# Patient Record
Sex: Female | Born: 1945
Health system: Southern US, Community
[De-identification: ages and names within clinical notes are randomized; demographics above are authoritative.]

## PROBLEM LIST (undated history)

## (undated) HISTORY — PX: EYE SURGERY: SHX253

## (undated) HISTORY — PX: CATARACT EXTRACTION: SUR2

---

## 1975-08-03 HISTORY — PX: TUBAL LIGATION: SHX77

## 1999-08-03 HISTORY — PX: ABDOMINAL HYSTERECTOMY: SHX81

## 2004-07-09 ENCOUNTER — Ambulatory Visit: Payer: Self-pay | Admitting: Family Medicine

## 2004-11-10 ENCOUNTER — Ambulatory Visit: Payer: Self-pay | Admitting: Unknown Physician Specialty

## 2005-07-12 ENCOUNTER — Ambulatory Visit: Payer: Self-pay | Admitting: Family Medicine

## 2006-07-19 ENCOUNTER — Ambulatory Visit: Payer: Self-pay | Admitting: Family Medicine

## 2007-07-24 ENCOUNTER — Ambulatory Visit: Payer: Self-pay | Admitting: Family Medicine

## 2008-07-24 ENCOUNTER — Ambulatory Visit: Payer: Self-pay | Admitting: Family Medicine

## 2008-08-02 HISTORY — PX: BREAST BIOPSY: SHX20

## 2008-08-06 ENCOUNTER — Ambulatory Visit: Payer: Self-pay | Admitting: Family Medicine

## 2010-08-26 DIAGNOSIS — H401190 Primary open-angle glaucoma, unspecified eye, stage unspecified: Secondary | ICD-10-CM | POA: Insufficient documentation

## 2010-08-26 DIAGNOSIS — H2510 Age-related nuclear cataract, unspecified eye: Secondary | ICD-10-CM | POA: Insufficient documentation

## 2010-08-26 DIAGNOSIS — Z9889 Other specified postprocedural states: Secondary | ICD-10-CM | POA: Insufficient documentation

## 2010-09-18 ENCOUNTER — Ambulatory Visit: Payer: Self-pay | Admitting: Unknown Physician Specialty

## 2010-09-22 LAB — PATHOLOGY REPORT

## 2010-10-21 DIAGNOSIS — H401290 Low-tension glaucoma, unspecified eye, stage unspecified: Secondary | ICD-10-CM | POA: Insufficient documentation

## 2011-02-16 DIAGNOSIS — S8263XA Displaced fracture of lateral malleolus of unspecified fibula, initial encounter for closed fracture: Secondary | ICD-10-CM | POA: Insufficient documentation

## 2011-04-21 DIAGNOSIS — M659 Synovitis and tenosynovitis, unspecified: Secondary | ICD-10-CM | POA: Insufficient documentation

## 2011-06-21 DIAGNOSIS — D481 Neoplasm of uncertain behavior of connective and other soft tissue: Secondary | ICD-10-CM | POA: Insufficient documentation

## 2011-06-21 DIAGNOSIS — L820 Inflamed seborrheic keratosis: Secondary | ICD-10-CM | POA: Insufficient documentation

## 2011-10-11 ENCOUNTER — Ambulatory Visit: Payer: Self-pay | Admitting: Unknown Physician Specialty

## 2011-10-13 LAB — PATHOLOGY REPORT

## 2013-02-12 DIAGNOSIS — D1801 Hemangioma of skin and subcutaneous tissue: Secondary | ICD-10-CM | POA: Insufficient documentation

## 2013-02-12 DIAGNOSIS — B078 Other viral warts: Secondary | ICD-10-CM | POA: Insufficient documentation

## 2014-03-14 LAB — HM DEXA SCAN

## 2014-08-12 LAB — HM MAMMOGRAPHY: HM MAMMO: NORMAL

## 2014-10-01 LAB — CBC AND DIFFERENTIAL
HEMATOCRIT: 42 % (ref 36–46)
Hemoglobin: 14.6 g/dL (ref 12.0–16.0)
Platelets: 212 10*3/uL (ref 150–399)
WBC: 5.7 10^3/mL

## 2014-10-07 LAB — HM COLONOSCOPY

## 2014-12-03 LAB — HEPATIC FUNCTION PANEL
ALT: 23 U/L (ref 7–35)
AST: 22 U/L (ref 13–35)

## 2014-12-03 LAB — LIPID PANEL
Cholesterol: 157 mg/dL (ref 0–200)
HDL: 49 mg/dL (ref 35–70)
LDL CALC: 89 mg/dL
TRIGLYCERIDES: 96 mg/dL (ref 40–160)

## 2014-12-03 LAB — BASIC METABOLIC PANEL
BUN: 12 mg/dL (ref 4–21)
CREATININE: 0.8 mg/dL (ref 0.5–1.1)
Glucose: 104 mg/dL
Potassium: 4 mmol/L (ref 3.4–5.3)
SODIUM: 139 mmol/L (ref 137–147)

## 2014-12-03 LAB — HEMOGLOBIN A1C: HEMOGLOBIN A1C: 5.9

## 2015-03-24 ENCOUNTER — Other Ambulatory Visit: Payer: Self-pay

## 2015-03-24 DIAGNOSIS — K219 Gastro-esophageal reflux disease without esophagitis: Secondary | ICD-10-CM

## 2015-03-24 MED ORDER — OMEPRAZOLE 20 MG PO CPDR: 20.0000 mg | DELAYED_RELEASE_CAPSULE | Freq: Two times a day (BID) | ORAL | Status: DC

## 2015-05-08 ENCOUNTER — Other Ambulatory Visit: Payer: Self-pay | Admitting: Family Medicine

## 2015-05-08 DIAGNOSIS — I10 Essential (primary) hypertension: Secondary | ICD-10-CM

## 2015-05-08 DIAGNOSIS — K219 Gastro-esophageal reflux disease without esophagitis: Secondary | ICD-10-CM

## 2015-05-08 DIAGNOSIS — E7800 Pure hypercholesterolemia, unspecified: Secondary | ICD-10-CM

## 2015-05-09 DIAGNOSIS — K635 Polyp of colon: Secondary | ICD-10-CM | POA: Insufficient documentation

## 2015-05-09 DIAGNOSIS — E7800 Pure hypercholesterolemia, unspecified: Secondary | ICD-10-CM | POA: Insufficient documentation

## 2015-05-09 DIAGNOSIS — R739 Hyperglycemia, unspecified: Secondary | ICD-10-CM | POA: Insufficient documentation

## 2015-05-09 DIAGNOSIS — I10 Essential (primary) hypertension: Secondary | ICD-10-CM | POA: Insufficient documentation

## 2015-05-09 DIAGNOSIS — M8580 Other specified disorders of bone density and structure, unspecified site: Secondary | ICD-10-CM | POA: Insufficient documentation

## 2015-05-09 DIAGNOSIS — K219 Gastro-esophageal reflux disease without esophagitis: Secondary | ICD-10-CM | POA: Insufficient documentation

## 2015-08-06 DIAGNOSIS — Z1231 Encounter for screening mammogram for malignant neoplasm of breast: Secondary | ICD-10-CM | POA: Diagnosis not present

## 2015-08-26 DIAGNOSIS — H409 Unspecified glaucoma: Secondary | ICD-10-CM | POA: Insufficient documentation

## 2015-09-15 ENCOUNTER — Encounter: Payer: Self-pay | Admitting: Family Medicine

## 2015-09-15 ENCOUNTER — Ambulatory Visit (INDEPENDENT_AMBULATORY_CARE_PROVIDER_SITE_OTHER): Payer: PPO | Admitting: Family Medicine

## 2015-09-15 VITALS — BP 104/70 | HR 80 | Temp 98.0°F | Resp 20 | Ht 61.0 in | Wt 134.0 lb

## 2015-09-15 DIAGNOSIS — Z0000 Encounter for general adult medical examination without abnormal findings: Secondary | ICD-10-CM

## 2015-09-15 DIAGNOSIS — H04129 Dry eye syndrome of unspecified lacrimal gland: Secondary | ICD-10-CM | POA: Insufficient documentation

## 2015-09-15 DIAGNOSIS — I10 Essential (primary) hypertension: Secondary | ICD-10-CM

## 2015-09-15 DIAGNOSIS — E7800 Pure hypercholesterolemia, unspecified: Secondary | ICD-10-CM

## 2015-09-15 DIAGNOSIS — M8580 Other specified disorders of bone density and structure, unspecified site: Secondary | ICD-10-CM | POA: Diagnosis not present

## 2015-09-15 DIAGNOSIS — R739 Hyperglycemia, unspecified: Secondary | ICD-10-CM | POA: Diagnosis not present

## 2015-09-15 DIAGNOSIS — J449 Chronic obstructive pulmonary disease, unspecified: Secondary | ICD-10-CM | POA: Insufficient documentation

## 2015-09-15 DIAGNOSIS — K2270 Barrett's esophagus without dysplasia: Secondary | ICD-10-CM | POA: Insufficient documentation

## 2015-09-15 NOTE — Progress Notes (Signed)
Patient ID: Sarah Vazquez, female   DOB: [birth year], 70 y.o.   MRN: [id number]       Patient: Sarah Vazquez, Female    DOB: Oct 28, 1945, 70 y.o.   MRN: [id number] Visit Date: 09/15/2015  Today's Provider: Margarita Rana, MD   Chief Complaint  Patient presents with  . Medicare Wellness   Subjective:    Annual wellness visit Sarah Vazquez is a 70 y.o. female. She feels well. She reports exercising 5 days a week. She reports she is sleeping well. 09/13/14 CPE 11/13/10 Pap-neg; HPV-neg 08/06/15 Mammo-BI-RADS 1 10/07/14 Colon-diverticulosis, recheck in 10 yrs 03/14/14 BMD-osteopenia  Lab Results  Component Value Date   WBC 5.7 10/01/2014   HGB 14.6 10/01/2014   HCT 42 10/01/2014   PLT 212 10/01/2014   CHOL 157 12/03/2014   TRIG 96 12/03/2014   HDL 49 12/03/2014   LDLCALC 89 12/03/2014   ALT 23 12/03/2014   AST 22 12/03/2014   NA 139 12/03/2014   K 4.0 12/03/2014   CREATININE 0.8 12/03/2014   BUN 12 12/03/2014   HGBA1C 5.9 12/03/2014    -----------------------------------------------------------   Review of Systems  Constitutional: Negative.   HENT: Positive for postnasal drip.   Eyes: Negative.   Respiratory: Negative.   Cardiovascular: Negative.   Gastrointestinal: Negative.   Endocrine: Negative.   Genitourinary: Negative.   Musculoskeletal: Positive for back pain and arthralgias.  Skin: Negative.   Allergic/Immunologic: Negative.   Neurological: Negative.   Hematological: Negative.   Psychiatric/Behavioral: Negative.     Social History   Social History  . Marital Status: Married    Spouse Name: N/A  . Number of Children: N/A  . Years of Education: N/A   Occupational History  . Not on file.   Social History Main Topics  . Smoking status: Never Smoker   . Smokeless tobacco: Never Used  . Alcohol Use: Yes     Comment: occasional  . Drug Use: No  . Sexual Activity: Not on file   Other Topics Concern  . Not on file   Social  History Narrative    History reviewed. No pertinent past medical history.   Patient Active Problem List   Diagnosis Date Noted  . Barrett esophagus 09/15/2015  . Chronic obstructive pulmonary disease ([place]) 09/15/2015  . Dry eye 09/15/2015  . Hypercholesterolemia 09/15/2015  . Glaucoma 08/26/2015  . Colon polyp 05/09/2015  . OP (osteoporosis) 05/09/2015  . Acid reflux 05/09/2015  . BP (high blood pressure) 05/09/2015  . Blood glucose elevated 05/09/2015  . Hypercholesteremia 05/09/2015  . Hemangioma of skin 02/12/2013  . Common wart 02/12/2013  . Inflamed seborrheic keratosis 06/21/2011  . Connective tissue tumor 06/21/2011  . Tenosynovitis of foot 04/21/2011  . Low tension glaucoma 10/21/2010  . Primary open angle glaucoma 08/26/2010  . Cataract, nuclear sclerotic senile 08/26/2010    Past Surgical History  Procedure Laterality Date  . Breast biopsy  2010  . Abdominal hysterectomy  2001  . Tubal ligation  1977  . Eye surgery Bilateral     glaucoma shunt   . Cataract extraction Bilateral     right 06/18/2010 left 10/2009    Her family history includes Alcohol abuse in her father; CAD in her father; Cancer in her maternal aunt, paternal aunt, paternal grandfather, and paternal uncle; Diabetes in her maternal aunt and maternal uncle; Emphysema in her mother; Heart disease in her father; Hyperlipidemia in her father; Hypertension in her father.    Previous Medications  ARTIFICIAL TEARS 0.1-0.3 % SOLN    At bedtime and PRN during the day   ASPIRIN 81 MG TABLET    Take 1 tablet by mouth daily.   BUSPIRONE (BUSPAR) 15 MG TABLET    Take 1 tablet by mouth at bedtime.    CALCIUM-VITAMIN D PO    Take 1 tablet by mouth 2 (two) times daily.    COENZYME Q10 (COQ10) 200 MG CAPS    Take 1 tablet by mouth daily.   MULTIPLE VITAMIN PO    Take 1 tablet by mouth every other day.    OMEGA-3 FATTY ACIDS (FISH OIL PO)    Take 2 capsules by mouth 2 (two) times daily.   OMEPRAZOLE (PRILOSEC)  20 MG CAPSULE    Take 1 capsule by mouth twice a day   PEG 3350-KCL-NABCB-NACL-NASULF (PEG-3350/ELECTROLYTES) 236 G SOLR       POLYETHYL GLYCOL-PROPYL GLYCOL 0.4-0.3 % SOLN    Apply to eye.   QUINAPRIL (ACCUPRIL) 10 MG TABLET    Take 1 tablet by mouth daily   SIMVASTATIN (ZOCOR) 10 MG TABLET    Take 1 tablet by mouth at bedtime    Patient Care Team: Margarita Rana, MD as PCP - General (Family Medicine)     Objective:   Vitals: BP 104/70 mmHg  Pulse 80  Temp(Src) 98 F (36.7 C) (Oral)  Resp 20  Ht 5\' 1"  (1.549 m)  Wt 134 lb (60.782 kg)  BMI 25.33 kg/m2  Physical Exam  Constitutional: She is oriented to person, place, and time. She appears well-developed and well-nourished.  HENT:  Head: Normocephalic and atraumatic.  Right Ear: Tympanic membrane, external ear and ear canal normal.  Left Ear: Tympanic membrane, external ear and ear canal normal.  Nose: Nose normal.  Mouth/Throat: Uvula is midline, oropharynx is clear and moist and mucous membranes are normal.  Eyes: Conjunctivae, EOM and lids are normal. Pupils are equal, round, and reactive to light.  Neck: Trachea normal and normal range of motion. Neck supple. Carotid bruit is not present. No thyroid mass and no thyromegaly present.  Cardiovascular: Normal rate, regular rhythm and normal heart sounds.   Pulmonary/Chest: Effort normal and breath sounds normal.  Abdominal: Soft. Normal appearance and bowel sounds are normal. There is no hepatosplenomegaly. There is no tenderness.  Musculoskeletal: Normal range of motion.  Lymphadenopathy:    She has no cervical adenopathy.    She has no axillary adenopathy.  Neurological: She is alert and oriented to person, place, and time. She has normal strength. No cranial nerve deficit.  Skin: Skin is warm, dry and intact.  Psychiatric: She has a normal mood and affect. Her speech is normal and behavior is normal. Judgment and thought content normal. Cognition and memory are normal.     Activities of Daily Living In your present state of health, do you have any difficulty performing the following activities: 09/15/2015  Hearing? Y  Vision? N  Difficulty concentrating or making decisions? N  Walking or climbing stairs? N  Dressing or bathing? N  Doing errands, shopping? N    Fall Risk Assessment Fall Risk  09/15/2015  Falls in the past year? No     Depression Screen PHQ 2/9 Scores 09/15/2015  PHQ - 2 Score 0    Cognitive Testing - 6-CIT  Correct? Score   What year is it? yes 0 0 or 4  What month is it? yes 0 0 or 3  Memorize:    Pia Mau,  [address],  [place],      What time is it? (within 1 hour) yes 0 0 or 3  Count backwards from 20 yes 0 0, 2, or 4  Name the months of the year yes 0 0, 2, or 4  Repeat name & address above no 1 0, 2, 4, 6, 8, or 10       TOTAL SCORE  1/28   Interpretation:  Normal  Normal (0-7) Abnormal (8-28)       Assessment & Plan:     Annual Wellness Visit  Reviewed patient's Family Medical History Reviewed and updated list of patient's medical providers Assessment of cognitive impairment was done Assessed patient's functional ability Established a written schedule for health screening [place] Completed and Reviewed  Exercise Activities and Dietary recommendations Goals    None      Immunization History  Administered Date(s) Administered  . Hepatitis A 05/12/2005  . Hepatitis B 06/09/2005, 01/14/2006  . Pneumococcal Conjugate-13 03/11/2014  . Pneumococcal Polysaccharide-23 01/04/2011  . Td 06/07/2008  . Tdap 06/07/2008      1. Medicare annual wellness visit, subsequent Stable. Patient advised to continue eating healthy and exercise daily.  2. Essential hypertension - CBC with Differential/Platelet - Comprehensive metabolic panel  3. Blood glucose elevated - Hemoglobin A1c  4. Hypercholesteremia - Lipid Panel With LDL/HDL Ratio - TSH  5. Osteopenia Stable.  Patient advised to F/U in the fall. BMD will be ordered at that time.    Patient seen and examined by Dr. Jerrell Belfast, and note scribed by Philbert Riser. Dimas, CMA.  I have reviewed the document for accuracy and completeness and I agree with above. Jerrell Belfast, MD   Margarita Rana, MD     ------------------------------------------------------------------------------------------------------------

## 2015-10-10 DIAGNOSIS — I10 Essential (primary) hypertension: Secondary | ICD-10-CM | POA: Diagnosis not present

## 2015-10-10 DIAGNOSIS — E7800 Pure hypercholesterolemia, unspecified: Secondary | ICD-10-CM | POA: Diagnosis not present

## 2015-10-10 DIAGNOSIS — R739 Hyperglycemia, unspecified: Secondary | ICD-10-CM | POA: Diagnosis not present

## 2015-10-11 LAB — CBC WITH DIFFERENTIAL/PLATELET
BASOS ABS: 0.1 10*3/uL (ref 0.0–0.2)
Basos: 1 %
EOS (ABSOLUTE): 0.1 10*3/uL (ref 0.0–0.4)
Eos: 3 %
HEMOGLOBIN: 14.2 g/dL (ref 11.1–15.9)
Hematocrit: 41.4 % (ref 34.0–46.6)
IMMATURE GRANS (ABS): 0 10*3/uL (ref 0.0–0.1)
Immature Granulocytes: 0 %
LYMPHS ABS: 1.6 10*3/uL (ref 0.7–3.1)
LYMPHS: 37 %
MCH: 29.3 pg (ref 26.6–33.0)
MCHC: 34.3 g/dL (ref 31.5–35.7)
MCV: 85 fL (ref 79–97)
MONOCYTES: 7 %
Monocytes Absolute: 0.3 10*3/uL (ref 0.1–0.9)
NEUTROS ABS: 2.3 10*3/uL (ref 1.4–7.0)
Neutrophils: 52 %
PLATELETS: 190 10*3/uL (ref 150–379)
RBC: 4.85 x10E6/uL (ref 3.77–5.28)
RDW: 13.6 % (ref 12.3–15.4)
WBC: 4.3 10*3/uL (ref 3.4–10.8)

## 2015-10-11 LAB — LIPID PANEL WITH LDL/HDL RATIO
CHOLESTEROL TOTAL: 172 mg/dL (ref 100–199)
HDL: 54 mg/dL (ref >39)
LDL Calculated: 98 mg/dL (ref 0–99)
LDL/HDL RATIO: 1.8 ratio (ref 0.0–3.2)
TRIGLYCERIDES: 101 mg/dL (ref 0–149)
VLDL Cholesterol Cal: 20 mg/dL (ref 5–40)

## 2015-10-11 LAB — COMPREHENSIVE METABOLIC PANEL
ALBUMIN: 4.3 g/dL (ref 3.6–4.8)
ALK PHOS: 65 IU/L (ref 39–117)
ALT: 23 IU/L (ref 0–32)
AST: 19 IU/L (ref 0–40)
Albumin/Globulin Ratio: 1.9 (ref 1.1–2.5)
BILIRUBIN TOTAL: 0.5 mg/dL (ref 0.0–1.2)
BUN/Creatinine Ratio: 19 (ref 11–26)
BUN: 16 mg/dL (ref 8–27)
CHLORIDE: 105 mmol/L (ref 96–106)
CO2: 25 mmol/L (ref 18–29)
CREATININE: 0.83 mg/dL (ref 0.57–1.00)
Calcium: 9.7 mg/dL (ref 8.7–10.3)
GFR calc Af Amer: 83 mL/min/{1.73_m2} (ref >59)
GFR calc non Af Amer: 72 mL/min/{1.73_m2} (ref >59)
GLUCOSE: 101 mg/dL — AB (ref 65–99)
Globulin, Total: 2.3 g/dL (ref 1.5–4.5)
Potassium: 4.3 mmol/L (ref 3.5–5.2)
Sodium: 144 mmol/L (ref 134–144)
Total Protein: 6.6 g/dL (ref 6.0–8.5)

## 2015-10-11 LAB — TSH: TSH: 2.72 u[IU]/mL (ref 0.450–4.500)

## 2015-10-11 LAB — HEMOGLOBIN A1C
ESTIMATED AVERAGE GLUCOSE: 120 mg/dL
HEMOGLOBIN A1C: 5.8 % — AB (ref 4.8–5.6)

## 2015-10-13 ENCOUNTER — Telehealth: Payer: Self-pay

## 2015-10-13 NOTE — Telephone Encounter (Signed)
-----   Message from Margarita Rana, MD sent at 10/11/2015  7:46 AM EST ----- Labs stable. Please notify patient. Thanks.

## 2015-10-13 NOTE — Telephone Encounter (Signed)
Pt advised.   Thanks,   -Payson Evrard  

## 2015-11-19 DIAGNOSIS — H401231 Low-tension glaucoma, bilateral, mild stage: Secondary | ICD-10-CM | POA: Diagnosis not present

## 2015-11-19 DIAGNOSIS — H2513 Age-related nuclear cataract, bilateral: Secondary | ICD-10-CM | POA: Diagnosis not present

## 2016-03-24 DIAGNOSIS — H2513 Age-related nuclear cataract, bilateral: Secondary | ICD-10-CM | POA: Diagnosis not present

## 2016-03-24 DIAGNOSIS — H401211 Low-tension glaucoma, right eye, mild stage: Secondary | ICD-10-CM | POA: Diagnosis not present

## 2016-03-24 DIAGNOSIS — H401222 Low-tension glaucoma, left eye, moderate stage: Secondary | ICD-10-CM | POA: Diagnosis not present

## 2016-06-07 DIAGNOSIS — R0789 Other chest pain: Secondary | ICD-10-CM | POA: Diagnosis not present

## 2016-06-07 DIAGNOSIS — Z8249 Family history of ischemic heart disease and other diseases of the circulatory system: Secondary | ICD-10-CM | POA: Diagnosis not present

## 2016-06-07 DIAGNOSIS — I10 Essential (primary) hypertension: Secondary | ICD-10-CM | POA: Diagnosis not present

## 2016-07-16 DIAGNOSIS — H353130 Nonexudative age-related macular degeneration, bilateral, stage unspecified: Secondary | ICD-10-CM | POA: Diagnosis not present

## 2016-07-16 DIAGNOSIS — H401232 Low-tension glaucoma, bilateral, moderate stage: Secondary | ICD-10-CM | POA: Diagnosis not present

## 2016-07-16 DIAGNOSIS — H2513 Age-related nuclear cataract, bilateral: Secondary | ICD-10-CM | POA: Diagnosis not present

## 2016-08-06 DIAGNOSIS — Z1231 Encounter for screening mammogram for malignant neoplasm of breast: Secondary | ICD-10-CM | POA: Diagnosis not present

## 2016-08-24 DIAGNOSIS — R0789 Other chest pain: Secondary | ICD-10-CM | POA: Diagnosis not present

## 2016-08-24 DIAGNOSIS — Z8249 Family history of ischemic heart disease and other diseases of the circulatory system: Secondary | ICD-10-CM | POA: Diagnosis not present

## 2016-09-07 DIAGNOSIS — R7303 Prediabetes: Secondary | ICD-10-CM | POA: Diagnosis not present

## 2016-09-07 DIAGNOSIS — M8580 Other specified disorders of bone density and structure, unspecified site: Secondary | ICD-10-CM | POA: Diagnosis not present

## 2016-09-07 DIAGNOSIS — E7800 Pure hypercholesterolemia, unspecified: Secondary | ICD-10-CM | POA: Diagnosis not present

## 2016-09-07 DIAGNOSIS — I10 Essential (primary) hypertension: Secondary | ICD-10-CM | POA: Diagnosis not present

## 2016-11-05 DIAGNOSIS — E7800 Pure hypercholesterolemia, unspecified: Secondary | ICD-10-CM | POA: Diagnosis not present

## 2016-11-17 DIAGNOSIS — H401211 Low-tension glaucoma, right eye, mild stage: Secondary | ICD-10-CM | POA: Diagnosis not present

## 2016-11-17 DIAGNOSIS — H2513 Age-related nuclear cataract, bilateral: Secondary | ICD-10-CM | POA: Diagnosis not present

## 2016-11-17 DIAGNOSIS — H353130 Nonexudative age-related macular degeneration, bilateral, stage unspecified: Secondary | ICD-10-CM | POA: Diagnosis not present

## 2016-11-17 DIAGNOSIS — H401222 Low-tension glaucoma, left eye, moderate stage: Secondary | ICD-10-CM | POA: Diagnosis not present

## 2017-01-12 DIAGNOSIS — H401111 Primary open-angle glaucoma, right eye, mild stage: Secondary | ICD-10-CM | POA: Diagnosis not present

## 2017-01-12 DIAGNOSIS — H401222 Low-tension glaucoma, left eye, moderate stage: Secondary | ICD-10-CM | POA: Diagnosis not present

## 2017-01-12 DIAGNOSIS — H2513 Age-related nuclear cataract, bilateral: Secondary | ICD-10-CM | POA: Diagnosis not present

## 2017-01-12 DIAGNOSIS — H3530 Unspecified macular degeneration: Secondary | ICD-10-CM | POA: Diagnosis not present

## 2017-01-20 ENCOUNTER — Telehealth: Payer: Self-pay | Admitting: Family Medicine

## 2017-03-28 DIAGNOSIS — E7800 Pure hypercholesterolemia, unspecified: Secondary | ICD-10-CM | POA: Diagnosis not present

## 2017-03-28 DIAGNOSIS — R7303 Prediabetes: Secondary | ICD-10-CM | POA: Diagnosis not present

## 2017-03-28 DIAGNOSIS — I10 Essential (primary) hypertension: Secondary | ICD-10-CM | POA: Diagnosis not present

## 2017-06-15 DIAGNOSIS — H2513 Age-related nuclear cataract, bilateral: Secondary | ICD-10-CM | POA: Diagnosis not present

## 2017-06-15 DIAGNOSIS — H401211 Low-tension glaucoma, right eye, mild stage: Secondary | ICD-10-CM | POA: Diagnosis not present

## 2017-06-15 DIAGNOSIS — H401222 Low-tension glaucoma, left eye, moderate stage: Secondary | ICD-10-CM | POA: Diagnosis not present

## 2017-06-15 DIAGNOSIS — H353230 Exudative age-related macular degeneration, bilateral, stage unspecified: Secondary | ICD-10-CM | POA: Diagnosis not present

## 2017-08-12 DIAGNOSIS — Z1231 Encounter for screening mammogram for malignant neoplasm of breast: Secondary | ICD-10-CM | POA: Diagnosis not present

## 2017-08-30 NOTE — Telephone Encounter (Signed)
Left message today regarding need to schedule [facility] if she is not following up with another provider.  Last Physical was 09/15/2015 with Dr Venia Minks

## 2017-08-30 NOTE — Telephone Encounter (Signed)
Pt returned call to let Amy know she is no longer a pt at [facility]. Thanks TNP

## 2017-09-28 DIAGNOSIS — R0982 Postnasal drip: Secondary | ICD-10-CM | POA: Diagnosis not present

## 2017-09-28 DIAGNOSIS — Z23 Encounter for immunization: Secondary | ICD-10-CM | POA: Diagnosis not present

## 2017-09-28 DIAGNOSIS — J309 Allergic rhinitis, unspecified: Secondary | ICD-10-CM | POA: Diagnosis not present

## 2017-09-28 DIAGNOSIS — R7303 Prediabetes: Secondary | ICD-10-CM | POA: Diagnosis not present

## 2017-09-28 DIAGNOSIS — E7800 Pure hypercholesterolemia, unspecified: Secondary | ICD-10-CM | POA: Diagnosis not present

## 2017-09-28 DIAGNOSIS — I10 Essential (primary) hypertension: Secondary | ICD-10-CM | POA: Diagnosis not present

## 2017-11-02 DIAGNOSIS — H401231 Low-tension glaucoma, bilateral, mild stage: Secondary | ICD-10-CM | POA: Diagnosis not present

## 2017-11-02 DIAGNOSIS — H353130 Nonexudative age-related macular degeneration, bilateral, stage unspecified: Secondary | ICD-10-CM | POA: Diagnosis not present

## 2017-11-02 DIAGNOSIS — H2513 Age-related nuclear cataract, bilateral: Secondary | ICD-10-CM | POA: Diagnosis not present

## 2017-11-30 DIAGNOSIS — I10 Essential (primary) hypertension: Secondary | ICD-10-CM | POA: Diagnosis not present

## 2017-11-30 DIAGNOSIS — E7800 Pure hypercholesterolemia, unspecified: Secondary | ICD-10-CM | POA: Diagnosis not present

## 2017-12-06 DIAGNOSIS — I10 Essential (primary) hypertension: Secondary | ICD-10-CM | POA: Diagnosis not present

## 2017-12-06 DIAGNOSIS — H6123 Impacted cerumen, bilateral: Secondary | ICD-10-CM | POA: Diagnosis not present

## 2017-12-06 DIAGNOSIS — E7800 Pure hypercholesterolemia, unspecified: Secondary | ICD-10-CM | POA: Diagnosis not present

## 2018-03-01 DIAGNOSIS — H2513 Age-related nuclear cataract, bilateral: Secondary | ICD-10-CM | POA: Diagnosis not present

## 2018-03-01 DIAGNOSIS — H401222 Low-tension glaucoma, left eye, moderate stage: Secondary | ICD-10-CM | POA: Diagnosis not present

## 2018-03-01 DIAGNOSIS — H401211 Low-tension glaucoma, right eye, mild stage: Secondary | ICD-10-CM | POA: Diagnosis not present

## 2018-03-01 DIAGNOSIS — H353130 Nonexudative age-related macular degeneration, bilateral, stage unspecified: Secondary | ICD-10-CM | POA: Diagnosis not present

## 2018-06-20 DIAGNOSIS — H401231 Low-tension glaucoma, bilateral, mild stage: Secondary | ICD-10-CM | POA: Diagnosis not present

## 2018-06-23 DIAGNOSIS — H353112 Nonexudative age-related macular degeneration, right eye, intermediate dry stage: Secondary | ICD-10-CM | POA: Diagnosis not present

## 2018-06-23 DIAGNOSIS — H2513 Age-related nuclear cataract, bilateral: Secondary | ICD-10-CM | POA: Diagnosis not present

## 2018-06-23 DIAGNOSIS — H353222 Exudative age-related macular degeneration, left eye, with inactive choroidal neovascularization: Secondary | ICD-10-CM | POA: Diagnosis not present

## 2018-08-01 ENCOUNTER — Encounter: Payer: Self-pay | Admitting: Podiatry

## 2018-08-01 ENCOUNTER — Ambulatory Visit: Payer: PPO | Admitting: Podiatry

## 2018-08-01 ENCOUNTER — Ambulatory Visit (INDEPENDENT_AMBULATORY_CARE_PROVIDER_SITE_OTHER): Payer: PPO

## 2018-08-01 ENCOUNTER — Encounter

## 2018-08-01 ENCOUNTER — Other Ambulatory Visit: Payer: Self-pay | Admitting: Podiatry

## 2018-08-01 VITALS — BP 118/67 | HR 77

## 2018-08-01 DIAGNOSIS — M779 Enthesopathy, unspecified: Secondary | ICD-10-CM

## 2018-08-01 DIAGNOSIS — M76822 Posterior tibial tendinitis, left leg: Secondary | ICD-10-CM

## 2018-08-08 ENCOUNTER — Telehealth: Payer: Self-pay | Admitting: Podiatry

## 2018-08-08 DIAGNOSIS — M76822 Posterior tibial tendinitis, left leg: Secondary | ICD-10-CM

## 2018-08-08 NOTE — Addendum Note (Signed)
Addended by: Graceann Congress D on: 08/08/2018 02:10 PM   Modules accepted: Orders

## 2018-08-08 NOTE — Telephone Encounter (Signed)
Pt was seen 12/31 and was supposed to have and MRI scheduled. Pt has not heard anything back about MRI. Please give pt a call.

## 2018-08-08 NOTE — Telephone Encounter (Signed)
Patient has been notified that no precert is required.  She will call main scheduling to set up her appt to her convenience.

## 2018-08-08 NOTE — Progress Notes (Signed)
   HPI: 73 year old female presents today for evaluation of left ankle pain.  Patient states that approximately 7 years ago she did fracture her ankle and it was managed nonsurgically.  She has orthotics but she says that the ankle feels like it is leaning inward and it is causing discomfort.  The discomfort has been persistent for the past 2 months with a gradual onset.  She has noticed that her arch has been collapsed to the left foot and ankle for several years now.  She presents today for further treatment and evaluation  History reviewed. No pertinent past medical history.   Physical Exam: General: The patient is alert and oriented x3 in no acute distress.  Dermatology: Skin is warm, dry and supple bilateral lower extremities. Negative for open lesions or macerations.  Vascular: Palpable pedal pulses bilaterally. No edema or erythema noted. Capillary refill within normal limits.  Neurological: Epicritic and protective threshold grossly intact bilaterally.   Musculoskeletal Exam: Collapse of the medial longitudinal arch noted with a significant amount of pain on palpation along the posterior tibial tendon.  Concomitant rear foot valgus also noted.  Range of motion within normal limits to all pedal and ankle joints bilateral. Muscle strength 5/5 in all groups bilateral.   Radiographic Exam:  Normal osseous mineralization. Joint spaces preserved. No fracture/dislocation/boney destruction.  Collapse of the calcaneal inclination angle and metatarsal declination angle noted with exposure of the head of the talus in relationship to the navicular  Assessment: 1.  Posterior tibial tendinitis left x years   Plan of Care:  1. Patient evaluated. X-Rays reviewed.  2.  Ankle brace dispensed to support the foot and ankle 3.  Today we can order an MRI left ankle to rule out the extent of the posterior tibial tendinitis and possible rupture of the posterior tibial tendon 4.  Continue current custom  molded insoles 5.  The patient may eventually need custom molded [place] type brace and new orthotics. 6.  Pending the MRI results the patient may also require surgery at one point. 7.  Return to clinic in 3 weeks to review MRI results      Edrick Kins, DPM [facility]  Dr. Edrick Kins, DPM    [address], [place] [address]                Office [phone]  Fax [phone]

## 2018-08-14 DIAGNOSIS — Z1231 Encounter for screening mammogram for malignant neoplasm of breast: Secondary | ICD-10-CM | POA: Diagnosis not present

## 2018-08-18 ENCOUNTER — Ambulatory Visit
Admission: RE | Admit: 2018-08-18 | Discharge: 2018-08-18 | Disposition: A | Discharge status: Home / Self Care | Payer: PPO | Source: Ambulatory Visit | Attending: Podiatry | Admitting: Podiatry

## 2018-08-18 DIAGNOSIS — M76822 Posterior tibial tendinitis, left leg: Secondary | ICD-10-CM | POA: Diagnosis not present

## 2018-08-18 DIAGNOSIS — M76821 Posterior tibial tendinitis, right leg: Secondary | ICD-10-CM | POA: Diagnosis not present

## 2018-08-22 ENCOUNTER — Encounter: Payer: Self-pay | Admitting: Podiatry

## 2018-08-22 ENCOUNTER — Ambulatory Visit: Payer: PPO | Admitting: Podiatry

## 2018-08-22 DIAGNOSIS — M76822 Posterior tibial tendinitis, left leg: Secondary | ICD-10-CM

## 2018-08-22 NOTE — Patient Instructions (Signed)
Pre-Operative Instructions  Congratulations, you have decided to take an important step towards improving your quality of life.  You can be assured that the doctors and staff at [facility] will be with you every step of the way.  Here are some important things you should know:  1. Plan to be at the surgery center/hospital at least 1 (one) hour prior to your scheduled time, unless otherwise directed by the surgical center/hospital staff.  You must have a responsible adult accompany you, remain during the surgery and drive you home.  Make sure you have directions to the surgical center/hospital to ensure you arrive on time. 2. If you are having surgery at [facility] or [facility] hospitals, you will need a copy of your medical history and physical form from your family physician within one month prior to the date of surgery. We will give you a form for your primary physician to complete.  3. We make every effort to accommodate the date you request for surgery.  However, there are times where surgery dates or times have to be moved.  We will contact you as soon as possible if a change in schedule is required.   4. No aspirin/ibuprofen for one week before surgery.  If you are on aspirin, any non-steroidal anti-inflammatory medications (Mobic, Aleve, Ibuprofen) should not be taken seven (7) days prior to your surgery.  You make take Tylenol for pain prior to surgery.  5. Medications - If you are taking daily heart and blood pressure medications, seizure, reflux, allergy, asthma, anxiety, pain or diabetes medications, make sure you notify the surgery center/hospital before the day of surgery so they can tell you which medications you should take or avoid the day of surgery. 6. No food or drink after midnight the night before surgery unless directed otherwise by surgical center/hospital staff. 7. No alcoholic beverages 24-hours prior to surgery.  No smoking 24-hours prior or 24-hours after  surgery. 8. Wear loose pants or shorts. They should be loose enough to fit over bandages, boots, and casts. 9. Don't wear slip-on shoes. Sneakers are preferred. 10. Bring your boot with you to the surgery center/hospital.  Also bring crutches or a walker if your physician has prescribed it for you.  If you do not have this equipment, it will be provided for you after surgery. 11. If you have not been contacted by the surgery center/hospital by the day before your surgery, call to confirm the date and time of your surgery. 12. Leave-time from work may vary depending on the type of surgery you have.  Appropriate arrangements should be made prior to surgery with your employer. 13. Prescriptions will be provided immediately following surgery by your doctor.  Fill these as soon as possible after surgery and take the medication as directed. Pain medications will not be refilled on weekends and must be approved by the doctor. 14. Remove nail polish on the operative foot and avoid getting pedicures prior to surgery. 15. Wash the night before surgery.  The night before surgery wash the foot and leg well with water and the antibacterial soap provided. Be sure to pay special attention to beneath the toenails and in between the toes.  Wash for at least three (3) minutes. Rinse thoroughly with water and dry well with a towel.  Perform this wash unless told not to do so by your physician.  Enclosed: 1 Ice pack (please put in freezer the night before surgery)   1 Hibiclens skin cleaner     Pre-op instructions  If you have any questions regarding the instructions, please do not hesitate to call our office.  [place]: [address], [place], [place] [address] -- [phone]  [place]: [address] [place], [place] [address] -- [phone]  [place]: [address]  [place], [place] [address] -- [phone]  [place]: [address], [address], [place],  [address] -- [phone]  Website:  [email or web address] 

## 2018-08-23 ENCOUNTER — Telehealth: Payer: Self-pay | Admitting: *Deleted

## 2018-08-23 NOTE — Telephone Encounter (Signed)
"  I need to schedule my surgery with Dr. Amalia Hailey as soon as possible.  Please call me back."

## 2018-08-24 NOTE — Telephone Encounter (Signed)
"  I spoke to the surgical center and they are in-network with my insurance.  I'd like to schedule my surgery before my upcoming vacation.  I'd like to do it before April."  Dr. Rebekah [place] next available date is March 12.  Would you like that date?  "That date will be fine.  Will I need to see my primary care doctor prior to having my surgery?"  I don't have you paperwork at this time to see if he requested it.  Do you have any serious health issues?  "No, I do not."  You probably will not need to.  "What time will I need to be there?"  Someone from the surgical center will give you a call a day or two prior to your surgical date and they will give you your arrival time.  "Okay, thank you so much."  (Please send paperwork)

## 2018-08-28 NOTE — Progress Notes (Signed)
   HPI: 73 year old female presents today for follow up evaluation of left PT tendinitis. She states her pain has improved some but has not resolved. She has been using the ankle brace as directed. There are no modifying factors noted. She presents today for further treatment and evaluation.  No past medical history on file.   Physical Exam: General: The patient is alert and oriented x3 in no acute distress.  Dermatology: Skin is warm, dry and supple bilateral lower extremities. Negative for open lesions or macerations.  Vascular: Palpable pedal pulses bilaterally. No edema or erythema noted. Capillary refill within normal limits.  Neurological: Epicritic and protective threshold grossly intact bilaterally.   Musculoskeletal Exam: Collapse of the medial longitudinal arch noted with a significant amount of pain on palpation along the posterior tibial tendon.  Concomitant rear foot valgus also noted.  Range of motion within normal limits to all pedal and ankle joints bilateral. Muscle strength 5/5 in all groups bilateral.   MRI Impression:  1. Intrasubstance longitudinal split tear of the posterior tibialis tendon with a single point of extension to the tendon surface with adjacent inflammation of the soft tissues and in the adjacent medial malleolus. 2. Slight degenerative hypertrophy of the distal Achilles tendon.  Assessment: 1.  Posterior tibial tendinitis left x years 2. Longitudinal split tear PT tendon left    Plan of Care:  1. Patient evaluated. MRI reviewed.  2. Today we discussed the conservative versus surgical management of the presenting pathology. The patient opts for surgical management. All possible complications and details of the procedure were explained. All patient questions were answered. No guarantees were expressed or implied. 3. Authorization for surgery was initiated today. Surgery will consist of posterior tibial tendon repair left.  4. Appointment with Liliane Channel,  Pedorthist, for custom molded orthotics to wear after surgery. 5. Return to clinic one week post op.      Edrick Kins, DPM [facility]  Dr. Edrick Kins, DPM    [address],  [address]                Office [phone]  Fax [phone]

## 2018-08-30 ENCOUNTER — Ambulatory Visit (INDEPENDENT_AMBULATORY_CARE_PROVIDER_SITE_OTHER): Payer: PPO | Admitting: Orthotics

## 2018-08-30 DIAGNOSIS — M76821 Posterior tibial tendinitis, right leg: Secondary | ICD-10-CM

## 2018-08-30 DIAGNOSIS — M76822 Posterior tibial tendinitis, left leg: Secondary | ICD-10-CM

## 2018-08-30 NOTE — Progress Notes (Signed)
Patient presents today with a hx of PTTD/AAF.  Upon assessment, patient has pronounced pes planus w/ a valgus RF deformity.  Patient has medially shifted talus/navicular.  Goal is provide longitudinal arch support and RF stability.  Plan on deep heel cup, hug arch, wide foot orthosis w/ medial flange and varus correction for RF valgus deformity.  Patient educated in the progessive nature of PTTD and financial responsibility.  

## 2018-09-15 DIAGNOSIS — H401232 Low-tension glaucoma, bilateral, moderate stage: Secondary | ICD-10-CM | POA: Diagnosis not present

## 2018-09-15 DIAGNOSIS — H353131 Nonexudative age-related macular degeneration, bilateral, early dry stage: Secondary | ICD-10-CM | POA: Diagnosis not present

## 2018-09-15 DIAGNOSIS — H2513 Age-related nuclear cataract, bilateral: Secondary | ICD-10-CM | POA: Diagnosis not present

## 2018-09-15 DIAGNOSIS — H35363 Drusen (degenerative) of macula, bilateral: Secondary | ICD-10-CM | POA: Diagnosis not present

## 2018-09-20 ENCOUNTER — Ambulatory Visit: Payer: PPO | Admitting: Orthotics

## 2018-09-20 DIAGNOSIS — M76822 Posterior tibial tendinitis, left leg: Secondary | ICD-10-CM

## 2018-09-20 NOTE — Progress Notes (Signed)
Patient came in today to pick up custom made foot orthotics.  The goals were accomplished and the patient reported no dissatisfaction with said orthotics.  Patient was advised of breakin period and how to report any issues. 

## 2018-10-03 DIAGNOSIS — H353131 Nonexudative age-related macular degeneration, bilateral, early dry stage: Secondary | ICD-10-CM | POA: Diagnosis not present

## 2018-10-03 DIAGNOSIS — H401232 Low-tension glaucoma, bilateral, moderate stage: Secondary | ICD-10-CM | POA: Diagnosis not present

## 2018-10-03 DIAGNOSIS — H2513 Age-related nuclear cataract, bilateral: Secondary | ICD-10-CM | POA: Diagnosis not present

## 2018-10-12 ENCOUNTER — Telehealth: Payer: Self-pay | Admitting: Podiatry

## 2018-10-12 DIAGNOSIS — M76822 Posterior tibial tendinitis, left leg: Secondary | ICD-10-CM | POA: Diagnosis not present

## 2018-10-12 DIAGNOSIS — M66372 Spontaneous rupture of flexor tendons, left ankle and foot: Secondary | ICD-10-CM | POA: Diagnosis not present

## 2018-10-12 DIAGNOSIS — E7800 Pure hypercholesterolemia, unspecified: Secondary | ICD-10-CM | POA: Diagnosis not present

## 2018-10-12 DIAGNOSIS — M25572 Pain in left ankle and joints of left foot: Secondary | ICD-10-CM | POA: Diagnosis not present

## 2018-10-12 NOTE — Telephone Encounter (Signed)
Left message for pharmacy to fill the percocet and motrin Dr. Amalia Hailey had wanted pt to have good pain coverage.

## 2018-10-12 NOTE — Telephone Encounter (Signed)
Pharmacy called about Rx dropped off for percocet and motrin. Pt has requested [place] not to fill the motrin. Calling to see if that is okay since both prescriptions are written on the same script.

## 2018-10-12 NOTE — Telephone Encounter (Signed)
Left message informing pt Dr. Amalia Hailey had prescribed motrin and percocet to have good pain coverage, to take the motrin in between the dosing of the percocet, and take a little snack before taking each.

## 2018-10-12 NOTE — Telephone Encounter (Signed)
I informed [facility], pharmacist, I had left message for pt reasoning for the motrin, then pt called and states she has motrin, so cancelled the motrin orders with [facility].

## 2018-10-16 ENCOUNTER — Telehealth: Payer: Self-pay

## 2018-10-16 ENCOUNTER — Encounter: Payer: Self-pay | Admitting: Podiatry

## 2018-10-16 NOTE — Telephone Encounter (Signed)
Called pt post-surgery; pt stated, "doing very good, only taking low dose ibuprofen, not having much pain"; asked pt if bandages were still in tact; she states, "yes, will see him in the office Friday"; informed pt to continue to elevate foot and stay off of it as much as possible until then.

## 2018-10-20 ENCOUNTER — Other Ambulatory Visit: Payer: Self-pay

## 2018-10-20 ENCOUNTER — Ambulatory Visit (INDEPENDENT_AMBULATORY_CARE_PROVIDER_SITE_OTHER): Payer: Self-pay | Admitting: Podiatry

## 2018-10-20 VITALS — BP 105/72 | HR 71 | Temp 98.5°F

## 2018-10-20 DIAGNOSIS — M76822 Posterior tibial tendinitis, left leg: Secondary | ICD-10-CM

## 2018-10-20 DIAGNOSIS — Z9889 Other specified postprocedural states: Secondary | ICD-10-CM

## 2018-10-24 NOTE — Progress Notes (Signed)
   Subjective:  Patient presents today status post posterior tibial tendon repair left. DOS: 10/12/2018. She states she is doing well. She reports some mild pain that is tolerable with Ibuprofen. There are no modifying factors noted. She denies nausea, vomiting, SOB, CP, fever or chills. Patient is here for further evaluation and treatment.    No past medical history on file.    Objective/Physical Exam Neurovascular status intact.  Skin incisions appear to be well coapted with sutures and staples intact. No sign of infectious process noted. No dehiscence. No active bleeding noted. Moderate edema noted to the surgical extremity.  Assessment: 1. s/p posterior tibial tendon repair left. DOS: 10/12/2018   Plan of Care:  1. Patient was evaluated. 2. Dressing changed. Keep clean, dry and intact for one week.  3. Continue nonweightbearing in CAM boot with walker.  4. Return to clinic in one week for staple removal.    Edrick Kins, DPM [facility]  Dr. Edrick Kins, [address]                                        [place], [place] [address]                Office [phone]  Fax [phone]

## 2018-10-31 ENCOUNTER — Ambulatory Visit (INDEPENDENT_AMBULATORY_CARE_PROVIDER_SITE_OTHER): Payer: PPO | Admitting: Podiatry

## 2018-10-31 ENCOUNTER — Other Ambulatory Visit: Payer: Self-pay

## 2018-10-31 ENCOUNTER — Encounter: Payer: Self-pay | Admitting: Podiatry

## 2018-10-31 VITALS — Temp 97.5°F

## 2018-10-31 DIAGNOSIS — M76822 Posterior tibial tendinitis, left leg: Secondary | ICD-10-CM

## 2018-10-31 DIAGNOSIS — Z9889 Other specified postprocedural states: Secondary | ICD-10-CM

## 2018-10-31 NOTE — Progress Notes (Signed)
   Subjective:  Patient presents today status post posterior tibial tendon repair left. DOS: 10/12/2018. She states she is doing well. She reports some mild pain that is tolerable with Ibuprofen.  The patient has been nonweightbearing in the immobilization cam boot using a walker.  She states that she continues to get some swelling throughout the day.  No new complaints at this time   No past medical history on file.   Objective/Physical Exam Neurovascular status intact.  Skin incisions appear to be well coapted with sutures and staples intact. No sign of infectious process noted. No dehiscence. No active bleeding noted. Moderate edema noted to the surgical extremity.  Negative for any significant pain on palpation along the incision site  Assessment: 1. s/p posterior tibial tendon repair left. DOS: 10/12/2018   Plan of Care:  1. Patient was evaluated. 2.  Stainless steel skin staples were removed today 3.  Compression anklet dispensed 4.  Continue strict nonweightbearing in the cam boot x2 weeks.  At that time she can begin weightbearing and slowly transition into good supportive tennis shoes 5.  Return to clinic in 4 weeks   Edrick Kins, DPM [facility]  Dr. Edrick Kins, [address]                                        [place], [place] [address]                Office [phone]  Fax [phone]

## 2018-11-28 ENCOUNTER — Other Ambulatory Visit: Payer: Self-pay

## 2018-11-28 ENCOUNTER — Ambulatory Visit (INDEPENDENT_AMBULATORY_CARE_PROVIDER_SITE_OTHER): Payer: PPO | Admitting: Podiatry

## 2018-11-28 ENCOUNTER — Encounter: Payer: Self-pay | Admitting: Podiatry

## 2018-11-28 VITALS — Temp 97.7°F

## 2018-11-28 DIAGNOSIS — M76822 Posterior tibial tendinitis, left leg: Secondary | ICD-10-CM

## 2018-11-28 DIAGNOSIS — Z9889 Other specified postprocedural states: Secondary | ICD-10-CM

## 2018-11-28 NOTE — Progress Notes (Signed)
   Subjective:  Patient presents today status post posterior tibial tendon repair left. DOS: 10/12/2018.  Patient states there has been some improvement since last visit.  She was unable to weightbearing in the immobilization cam boot because it caused a flareup of her knee and hip and sciatica to the surgical extremity.  She is currently wearing good supportive new balance shoes with her custom molded insoles.  No past medical history on file.   Objective/Physical Exam Neurovascular status intact.  Skin incisions appear to be well coapted with complete healing.  Minimal edema noted to the surgical extremity.  There is some very mild pain on palpation noted just posterior to the medial malleolus.  Range of motion within normal limits and muscle strength 5/5 all compartments.  Assessment: 1. s/p posterior tibial tendon repair left. DOS: 10/12/2018   Plan of Care:  1. Patient was evaluated.  Patient may slowly increase activity and eventually resume full activity no restrictions 2.  Continue wearing custom molded insoles with new balance shoes 3.  Appointment with Liliane Channel for orthotics modification 4.  Return to clinic as needed  Edrick Kins, DPM [facility]  Dr. Edrick Kins, [address]                                        [place], [place] [address]                Office [phone]  Fax [phone]

## 2018-11-29 ENCOUNTER — Ambulatory Visit: Payer: PPO | Admitting: Orthotics

## 2018-11-29 DIAGNOSIS — M76822 Posterior tibial tendinitis, left leg: Secondary | ICD-10-CM

## 2018-11-29 NOTE — Progress Notes (Signed)
Adjust f/o by adding varus posts b/l.

## 2019-01-16 DIAGNOSIS — J019 Acute sinusitis, unspecified: Secondary | ICD-10-CM | POA: Diagnosis not present

## 2019-01-16 DIAGNOSIS — H6120 Impacted cerumen, unspecified ear: Secondary | ICD-10-CM | POA: Diagnosis not present

## 2019-01-16 DIAGNOSIS — E7800 Pure hypercholesterolemia, unspecified: Secondary | ICD-10-CM | POA: Diagnosis not present

## 2019-01-16 DIAGNOSIS — I10 Essential (primary) hypertension: Secondary | ICD-10-CM | POA: Diagnosis not present

## 2019-01-16 DIAGNOSIS — M8589 Other specified disorders of bone density and structure, multiple sites: Secondary | ICD-10-CM | POA: Diagnosis not present

## 2019-01-17 ENCOUNTER — Other Ambulatory Visit: Payer: Self-pay

## 2019-01-17 ENCOUNTER — Ambulatory Visit: Payer: PPO | Admitting: Orthotics

## 2019-01-17 ENCOUNTER — Other Ambulatory Visit: Payer: PPO | Admitting: Orthotics

## 2019-01-17 DIAGNOSIS — M76822 Posterior tibial tendinitis, left leg: Secondary | ICD-10-CM

## 2019-01-17 DIAGNOSIS — H2513 Age-related nuclear cataract, bilateral: Secondary | ICD-10-CM | POA: Diagnosis not present

## 2019-01-17 DIAGNOSIS — H353131 Nonexudative age-related macular degeneration, bilateral, early dry stage: Secondary | ICD-10-CM | POA: Diagnosis not present

## 2019-01-17 DIAGNOSIS — H401232 Low-tension glaucoma, bilateral, moderate stage: Secondary | ICD-10-CM | POA: Diagnosis not present

## 2019-01-17 NOTE — Progress Notes (Signed)
Adjusted f/o by added 2* more RF medial post

## 2019-01-18 DIAGNOSIS — H353221 Exudative age-related macular degeneration, left eye, with active choroidal neovascularization: Secondary | ICD-10-CM | POA: Diagnosis not present

## 2019-01-18 DIAGNOSIS — H2513 Age-related nuclear cataract, bilateral: Secondary | ICD-10-CM | POA: Diagnosis not present

## 2019-01-18 DIAGNOSIS — H401230 Low-tension glaucoma, bilateral, stage unspecified: Secondary | ICD-10-CM | POA: Diagnosis not present

## 2019-01-18 DIAGNOSIS — H353112 Nonexudative age-related macular degeneration, right eye, intermediate dry stage: Secondary | ICD-10-CM | POA: Diagnosis not present

## 2019-01-19 DIAGNOSIS — M85852 Other specified disorders of bone density and structure, left thigh: Secondary | ICD-10-CM | POA: Diagnosis not present

## 2019-01-19 DIAGNOSIS — M810 Age-related osteoporosis without current pathological fracture: Secondary | ICD-10-CM | POA: Diagnosis not present

## 2019-01-19 DIAGNOSIS — M8589 Other specified disorders of bone density and structure, multiple sites: Secondary | ICD-10-CM | POA: Diagnosis not present

## 2019-01-19 DIAGNOSIS — Z780 Asymptomatic menopausal state: Secondary | ICD-10-CM | POA: Diagnosis not present

## 2019-01-25 DIAGNOSIS — I10 Essential (primary) hypertension: Secondary | ICD-10-CM | POA: Diagnosis not present

## 2019-01-25 DIAGNOSIS — Z1159 Encounter for screening for other viral diseases: Secondary | ICD-10-CM | POA: Diagnosis not present

## 2019-01-25 DIAGNOSIS — E7800 Pure hypercholesterolemia, unspecified: Secondary | ICD-10-CM | POA: Diagnosis not present

## 2019-02-23 DIAGNOSIS — H353221 Exudative age-related macular degeneration, left eye, with active choroidal neovascularization: Secondary | ICD-10-CM | POA: Diagnosis not present

## 2019-03-27 DIAGNOSIS — M79672 Pain in left foot: Secondary | ICD-10-CM | POA: Diagnosis not present

## 2019-03-27 DIAGNOSIS — E7800 Pure hypercholesterolemia, unspecified: Secondary | ICD-10-CM | POA: Diagnosis not present

## 2019-03-27 DIAGNOSIS — I10 Essential (primary) hypertension: Secondary | ICD-10-CM | POA: Diagnosis not present

## 2019-03-27 DIAGNOSIS — M810 Age-related osteoporosis without current pathological fracture: Secondary | ICD-10-CM | POA: Diagnosis not present

## 2019-04-04 DIAGNOSIS — Z0000 Encounter for general adult medical examination without abnormal findings: Secondary | ICD-10-CM | POA: Diagnosis not present

## 2019-04-05 DIAGNOSIS — H353112 Nonexudative age-related macular degeneration, right eye, intermediate dry stage: Secondary | ICD-10-CM | POA: Diagnosis not present

## 2019-04-05 DIAGNOSIS — H353221 Exudative age-related macular degeneration, left eye, with active choroidal neovascularization: Secondary | ICD-10-CM | POA: Diagnosis not present

## 2019-05-18 DIAGNOSIS — H353221 Exudative age-related macular degeneration, left eye, with active choroidal neovascularization: Secondary | ICD-10-CM | POA: Diagnosis not present

## 2019-06-05 DIAGNOSIS — H353230 Exudative age-related macular degeneration, bilateral, stage unspecified: Secondary | ICD-10-CM | POA: Diagnosis not present

## 2019-06-05 DIAGNOSIS — H401232 Low-tension glaucoma, bilateral, moderate stage: Secondary | ICD-10-CM | POA: Diagnosis not present

## 2019-06-05 DIAGNOSIS — H2513 Age-related nuclear cataract, bilateral: Secondary | ICD-10-CM | POA: Diagnosis not present

## 2019-06-22 DIAGNOSIS — H353221 Exudative age-related macular degeneration, left eye, with active choroidal neovascularization: Secondary | ICD-10-CM | POA: Diagnosis not present

## 2019-06-22 DIAGNOSIS — H353112 Nonexudative age-related macular degeneration, right eye, intermediate dry stage: Secondary | ICD-10-CM | POA: Diagnosis not present

## 2019-07-17 DIAGNOSIS — K219 Gastro-esophageal reflux disease without esophagitis: Secondary | ICD-10-CM | POA: Diagnosis not present

## 2019-07-17 DIAGNOSIS — I10 Essential (primary) hypertension: Secondary | ICD-10-CM | POA: Diagnosis not present

## 2019-07-17 DIAGNOSIS — M8580 Other specified disorders of bone density and structure, unspecified site: Secondary | ICD-10-CM | POA: Diagnosis not present

## 2019-07-17 DIAGNOSIS — H3530 Unspecified macular degeneration: Secondary | ICD-10-CM | POA: Diagnosis not present

## 2019-07-17 DIAGNOSIS — H409 Unspecified glaucoma: Secondary | ICD-10-CM | POA: Diagnosis not present

## 2019-07-17 DIAGNOSIS — E7800 Pure hypercholesterolemia, unspecified: Secondary | ICD-10-CM | POA: Diagnosis not present

## 2019-07-20 DIAGNOSIS — H353221 Exudative age-related macular degeneration, left eye, with active choroidal neovascularization: Secondary | ICD-10-CM | POA: Diagnosis not present

## 2019-08-24 DIAGNOSIS — H353221 Exudative age-related macular degeneration, left eye, with active choroidal neovascularization: Secondary | ICD-10-CM | POA: Diagnosis not present

## 2019-08-24 DIAGNOSIS — H353112 Nonexudative age-related macular degeneration, right eye, intermediate dry stage: Secondary | ICD-10-CM | POA: Diagnosis not present

## 2019-08-27 DIAGNOSIS — Z1231 Encounter for screening mammogram for malignant neoplasm of breast: Secondary | ICD-10-CM | POA: Diagnosis not present

## 2019-10-12 DIAGNOSIS — H353221 Exudative age-related macular degeneration, left eye, with active choroidal neovascularization: Secondary | ICD-10-CM | POA: Diagnosis not present

## 2019-11-26 DIAGNOSIS — E7800 Pure hypercholesterolemia, unspecified: Secondary | ICD-10-CM | POA: Diagnosis not present

## 2019-11-26 DIAGNOSIS — I10 Essential (primary) hypertension: Secondary | ICD-10-CM | POA: Diagnosis not present

## 2019-11-26 DIAGNOSIS — R635 Abnormal weight gain: Secondary | ICD-10-CM | POA: Diagnosis not present

## 2019-11-26 DIAGNOSIS — M8580 Other specified disorders of bone density and structure, unspecified site: Secondary | ICD-10-CM | POA: Diagnosis not present

## 2019-11-30 DIAGNOSIS — H353112 Nonexudative age-related macular degeneration, right eye, intermediate dry stage: Secondary | ICD-10-CM | POA: Diagnosis not present

## 2019-11-30 DIAGNOSIS — H401234 Low-tension glaucoma, bilateral, indeterminate stage: Secondary | ICD-10-CM | POA: Diagnosis not present

## 2019-11-30 DIAGNOSIS — H353221 Exudative age-related macular degeneration, left eye, with active choroidal neovascularization: Secondary | ICD-10-CM | POA: Diagnosis not present

## 2019-11-30 DIAGNOSIS — H2513 Age-related nuclear cataract, bilateral: Secondary | ICD-10-CM | POA: Diagnosis not present

## 2019-12-04 DIAGNOSIS — H401232 Low-tension glaucoma, bilateral, moderate stage: Secondary | ICD-10-CM | POA: Diagnosis not present

## 2019-12-04 DIAGNOSIS — H353230 Exudative age-related macular degeneration, bilateral, stage unspecified: Secondary | ICD-10-CM | POA: Diagnosis not present

## 2019-12-04 DIAGNOSIS — H2513 Age-related nuclear cataract, bilateral: Secondary | ICD-10-CM | POA: Diagnosis not present

## 2019-12-04 DIAGNOSIS — H35363 Drusen (degenerative) of macula, bilateral: Secondary | ICD-10-CM | POA: Diagnosis not present

## 2020-01-24 DIAGNOSIS — R7989 Other specified abnormal findings of blood chemistry: Secondary | ICD-10-CM | POA: Diagnosis not present

## 2020-01-25 DIAGNOSIS — H353221 Exudative age-related macular degeneration, left eye, with active choroidal neovascularization: Secondary | ICD-10-CM | POA: Diagnosis not present

## 2020-03-14 DIAGNOSIS — H353221 Exudative age-related macular degeneration, left eye, with active choroidal neovascularization: Secondary | ICD-10-CM | POA: Diagnosis not present

## 2020-05-13 ENCOUNTER — Other Ambulatory Visit: Payer: Self-pay

## 2020-05-13 ENCOUNTER — Ambulatory Visit: Payer: PPO | Admitting: Podiatry

## 2020-05-13 DIAGNOSIS — M76822 Posterior tibial tendinitis, left leg: Secondary | ICD-10-CM

## 2020-05-13 MED ORDER — MELOXICAM 15 MG PO TABS: 15.0000 mg | ORAL_TABLET | Freq: Every day | ORAL | 1 refills | Status: DC

## 2020-05-13 NOTE — Progress Notes (Signed)
   Subjective:  Patient presents today for follow-up evaluation of a history of a posterior tibial tendon repair left. DOS: 10/12/2018.  Patient states that over the last year and a half she has continued to have some intermittent moderate pain.  Although the pain is not as severe as prior to surgery she continues to have achiness and pain to the bilateral feet.  She has been wearing her orthotics but she feels that the orthotics are slowly collapsing.  She presents for further treatment and evaluation and to possibly be arranged for new orthotics  No past medical history on file.   Objective: Physical Exam General: The patient is alert and oriented x3 in no acute distress.  Dermatology: Skin is cool, dry and supple bilateral lower extremities. Negative for open lesions or macerations.  Vascular: Palpable pedal pulses bilaterally. No edema or erythema noted. Capillary refill within normal limits.  Neurological: Epicritic and protective threshold grossly intact bilaterally.   Musculoskeletal Exam: All pedal and ankle joints range of motion within normal limits bilateral. Muscle strength 5/5 in all groups bilateral.  There is some mild tenderness to palpation along the bilateral posterior tibial tendon sheath.    Assessment: 1. s/p posterior tibial tendon repair left. DOS: 10/12/2018 2.  Posterior tibial tendinitis bilateral   Plan of Care:  1. Patient was evaluated.   2.  Appointment with Pedorthist for new custom molded orthotics 3.  Continue wearing good supportive sneakers and shoes 4.  Prescription for meloxicam 15 mg daily 5.  Return to clinic as needed  Edrick Kins, DPM [facility]  Dr. Edrick Kins, [address]                                        [place], [place] [address]                Office [phone]  Fax [phone]

## 2020-05-15 DIAGNOSIS — H2513 Age-related nuclear cataract, bilateral: Secondary | ICD-10-CM | POA: Diagnosis not present

## 2020-05-15 DIAGNOSIS — H353120 Nonexudative age-related macular degeneration, left eye, stage unspecified: Secondary | ICD-10-CM | POA: Diagnosis not present

## 2020-05-15 DIAGNOSIS — H401234 Low-tension glaucoma, bilateral, indeterminate stage: Secondary | ICD-10-CM | POA: Diagnosis not present

## 2020-05-15 DIAGNOSIS — H353221 Exudative age-related macular degeneration, left eye, with active choroidal neovascularization: Secondary | ICD-10-CM | POA: Diagnosis not present

## 2020-05-27 ENCOUNTER — Other Ambulatory Visit: Payer: Self-pay

## 2020-05-27 ENCOUNTER — Ambulatory Visit: Payer: PPO | Admitting: Orthotics

## 2020-05-27 DIAGNOSIS — M76822 Posterior tibial tendinitis, left leg: Secondary | ICD-10-CM

## 2020-05-27 DIAGNOSIS — Z9889 Other specified postprocedural states: Secondary | ICD-10-CM

## 2020-05-27 NOTE — Progress Notes (Signed)
Patient presents today with a hx of PTTD/AAF.  Upon assessment, patient has pronounced pes planus w/ a valgus RF deformity.  Patient has medially shifted talus/navicular.  Goal is provide longitudinal arch support and RF stability.  Plan on deep heel cup, hug arch, wide foot orthosis w/ medial flange and varus correction for RF valgus deformity.  Patient educated in the progessive nature of PTTD and financial responsibility.  

## 2020-06-09 DIAGNOSIS — M810 Age-related osteoporosis without current pathological fracture: Secondary | ICD-10-CM | POA: Diagnosis not present

## 2020-06-09 DIAGNOSIS — I10 Essential (primary) hypertension: Secondary | ICD-10-CM | POA: Diagnosis not present

## 2020-06-09 DIAGNOSIS — M8580 Other specified disorders of bone density and structure, unspecified site: Secondary | ICD-10-CM | POA: Diagnosis not present

## 2020-06-18 ENCOUNTER — Other Ambulatory Visit: Payer: Self-pay

## 2020-06-18 ENCOUNTER — Ambulatory Visit: Payer: PPO | Admitting: Orthotics

## 2020-06-18 DIAGNOSIS — M76822 Posterior tibial tendinitis, left leg: Secondary | ICD-10-CM

## 2020-06-18 NOTE — Progress Notes (Signed)
F/O will come into [place]; she will pick up on 11/18

## 2020-06-19 ENCOUNTER — Ambulatory Visit: Payer: PPO | Admitting: Orthotics

## 2020-06-19 DIAGNOSIS — M76822 Posterior tibial tendinitis, left leg: Secondary | ICD-10-CM

## 2020-06-19 NOTE — Progress Notes (Signed)
Patient came in today to pick up custom made foot orthotics.  The goals were accomplished and the patient reported no dissatisfaction with said orthotics.  Patient was advised of breakin period and how to report any issues. 

## 2020-06-24 DIAGNOSIS — H524 Presbyopia: Secondary | ICD-10-CM | POA: Diagnosis not present

## 2020-07-09 ENCOUNTER — Other Ambulatory Visit: Payer: PPO | Admitting: Orthotics

## 2020-07-09 ENCOUNTER — Other Ambulatory Visit: Payer: Self-pay

## 2020-07-10 DIAGNOSIS — H353231 Exudative age-related macular degeneration, bilateral, with active choroidal neovascularization: Secondary | ICD-10-CM | POA: Diagnosis not present

## 2020-07-10 DIAGNOSIS — H2513 Age-related nuclear cataract, bilateral: Secondary | ICD-10-CM | POA: Diagnosis not present

## 2020-07-11 DIAGNOSIS — H401232 Low-tension glaucoma, bilateral, moderate stage: Secondary | ICD-10-CM | POA: Diagnosis not present

## 2020-07-11 DIAGNOSIS — H2513 Age-related nuclear cataract, bilateral: Secondary | ICD-10-CM | POA: Diagnosis not present

## 2020-07-11 DIAGNOSIS — H353230 Exudative age-related macular degeneration, bilateral, stage unspecified: Secondary | ICD-10-CM | POA: Diagnosis not present

## 2020-07-11 DIAGNOSIS — H35362 Drusen (degenerative) of macula, left eye: Secondary | ICD-10-CM | POA: Diagnosis not present

## 2020-07-14 DIAGNOSIS — E785 Hyperlipidemia, unspecified: Secondary | ICD-10-CM | POA: Diagnosis not present

## 2020-07-14 DIAGNOSIS — M810 Age-related osteoporosis without current pathological fracture: Secondary | ICD-10-CM | POA: Diagnosis not present

## 2020-07-14 DIAGNOSIS — I10 Essential (primary) hypertension: Secondary | ICD-10-CM | POA: Diagnosis not present

## 2020-07-23 ENCOUNTER — Other Ambulatory Visit: Payer: Self-pay | Admitting: Podiatry

## 2020-08-06 ENCOUNTER — Other Ambulatory Visit: Payer: Self-pay

## 2020-08-06 ENCOUNTER — Ambulatory Visit: Payer: PPO | Admitting: Orthotics

## 2020-08-06 DIAGNOSIS — Z9889 Other specified postprocedural states: Secondary | ICD-10-CM

## 2020-08-06 DIAGNOSIS — M76822 Posterior tibial tendinitis, left leg: Secondary | ICD-10-CM

## 2020-08-06 NOTE — Progress Notes (Signed)
Adjusted foot orthotic by offloading navicular a bit with foam pad, also added foam varus skive.

## 2020-08-07 DIAGNOSIS — H353231 Exudative age-related macular degeneration, bilateral, with active choroidal neovascularization: Secondary | ICD-10-CM | POA: Diagnosis not present

## 2020-08-28 DIAGNOSIS — Z1231 Encounter for screening mammogram for malignant neoplasm of breast: Secondary | ICD-10-CM | POA: Diagnosis not present

## 2020-08-28 DIAGNOSIS — H353222 Exudative age-related macular degeneration, left eye, with inactive choroidal neovascularization: Secondary | ICD-10-CM | POA: Diagnosis not present

## 2020-09-04 IMAGING — MR MR ANKLE*L* W/O CM
5 series · 40 of 40 positions shown · non-contrast
Comparison: Radiographs dated 08/01/2018

CLINICAL DATA: Medial ankle pain. Posterior tibial tendon
dysfunction.

EXAM:
MRI OF THE LEFT ANKLE WITHOUT CONTRAST
TECHNIQUE: Multiplanar, multisequence MR imaging of the ankle was performed. No
intravenous contrast was administered.

[Series 3: PD fat-sat · axial · left · 3.0mm · 0.50mm/px · z∈[-63,+77]mm · 9 of 36 slices shown]
[im 1/36]
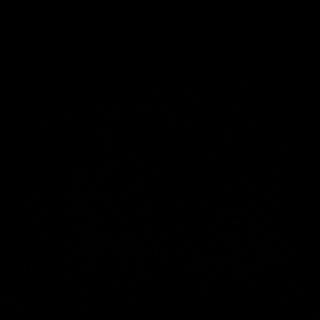
[im 5/36]
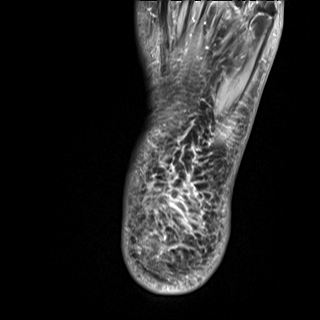
[im 9/36]
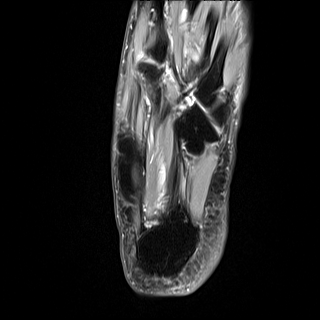
[im 14/36]
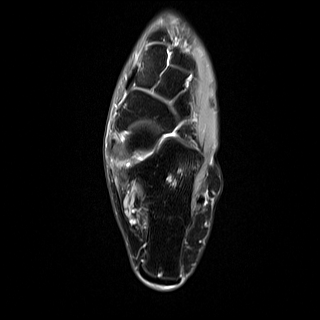
[im 18/36]
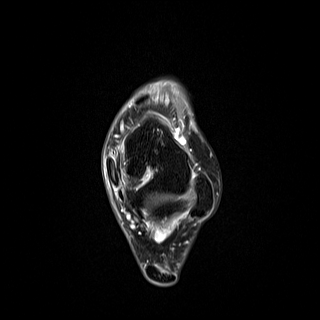
[im 22/36]
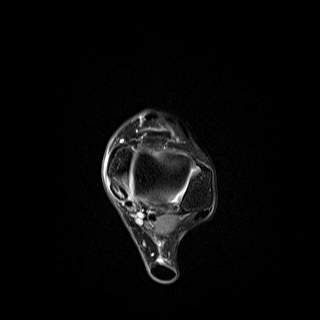
[im 27/36]
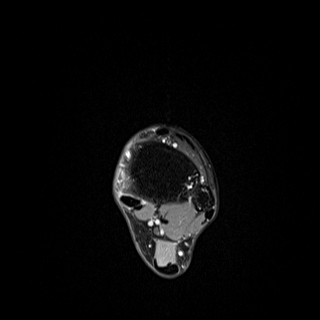
[im 31/36]
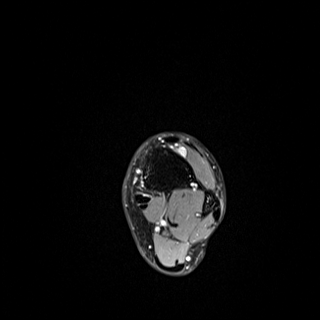
[im 36/36]
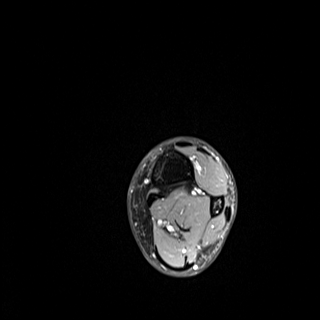

[Series 4: T2 fat-sat · axial · left · 3.0mm · 0.50mm/px · z∈[-63,+77]mm · 10 of 36 slices shown (1 of 2)]
[im 1/36]
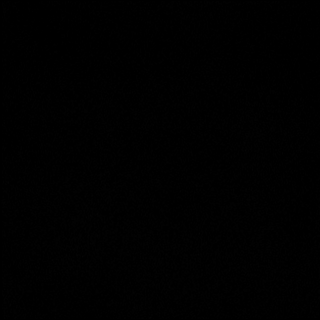
[im 4/36]
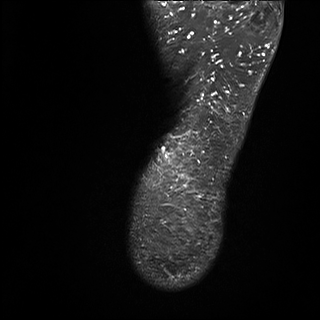
[im 8/36]
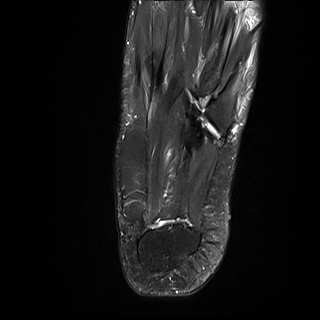
[im 12/36]
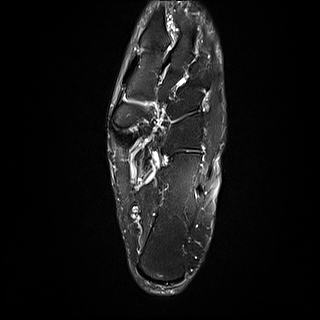
[im 16/36]
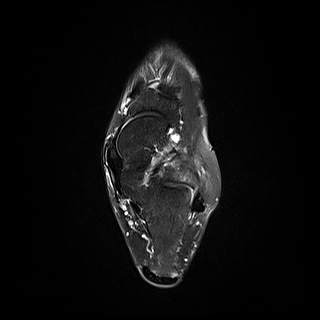
[im 20/36]
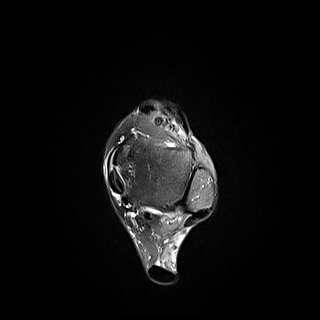
[im 24/36]
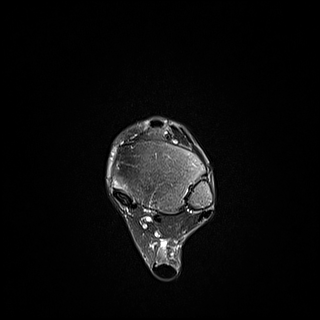
[im 28/36]
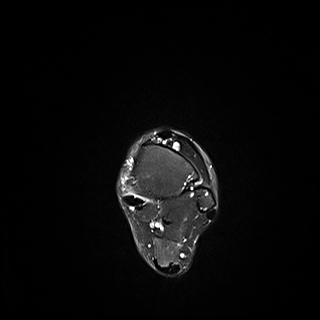
[im 32/36]
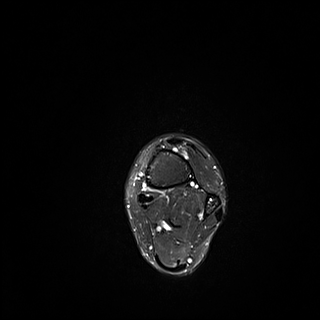
[im 36/36]
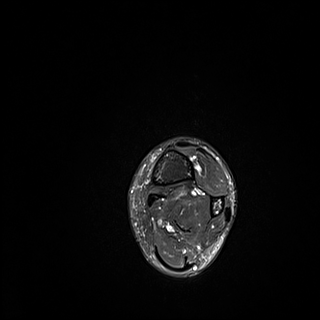

[Series 5: T2 fat-sat · coronal · left · 3.0mm · 0.62mm/px · 11 of 40 slices shown (2 of 2)]
[im 1/40]
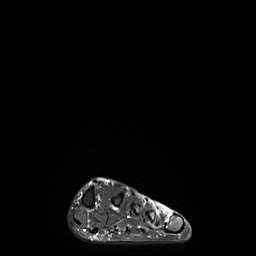
[im 4/40]
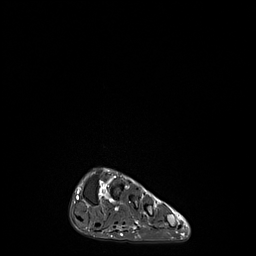
[im 8/40]
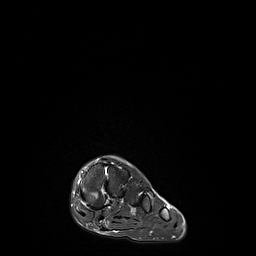
[im 12/40]
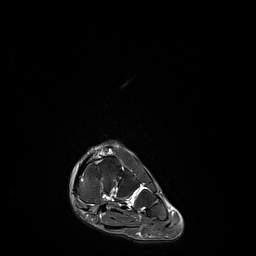
[im 16/40]
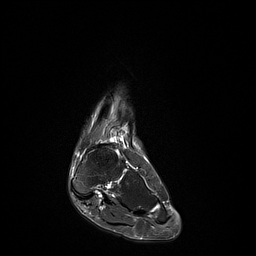
[im 20/40]
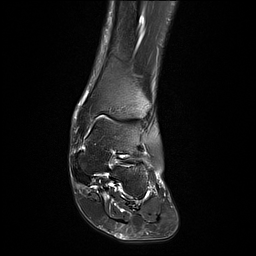
[im 24/40]
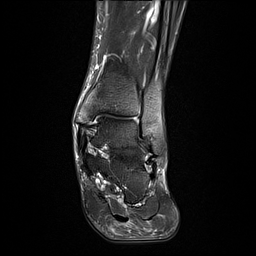
[im 28/40]
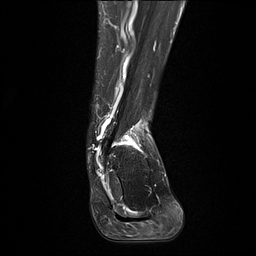
[im 32/40]
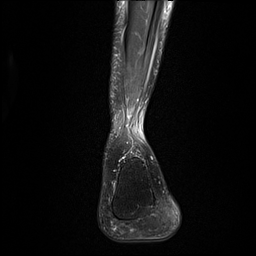
[im 36/40]
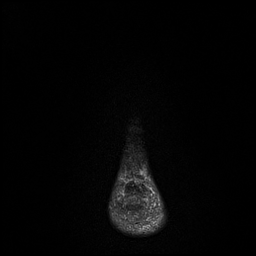
[im 40/40]
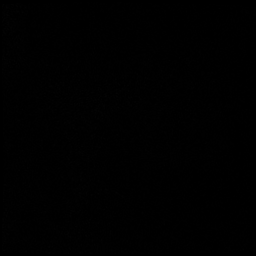

[Series 6: T1 · sagittal · left · 4.0mm · 0.70mm/px · 5 of 17 slices shown]
[im 1/17]
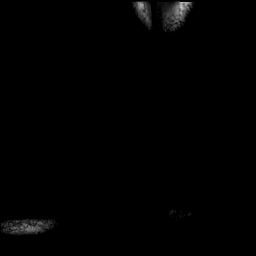
[im 5/17]
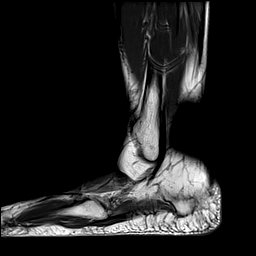
[im 9/17]
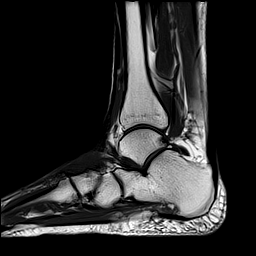
[im 13/17]
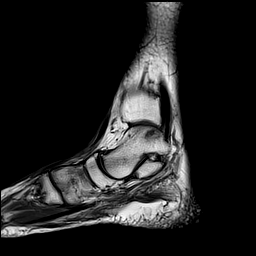
[im 17/17]
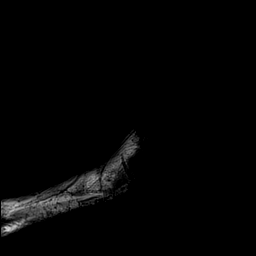

[Series 7: STIR · sagittal · left · 4.0mm · 0.35mm/px · 5 of 17 slices shown]
[im 1/17]
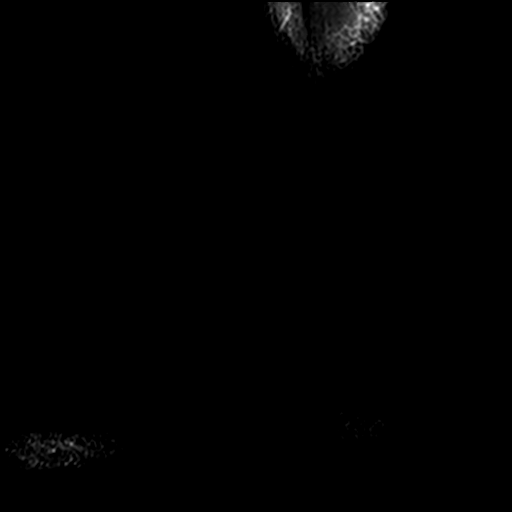
[im 5/17]
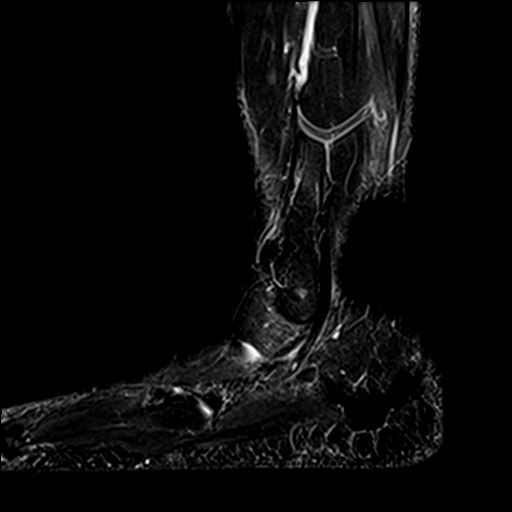
[im 9/17]
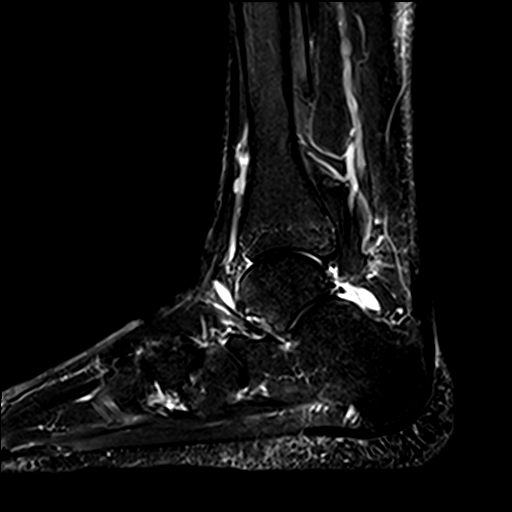
[im 13/17]
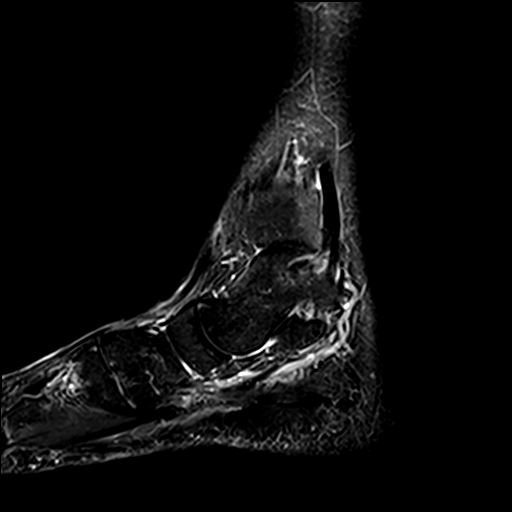
[im 17/17]
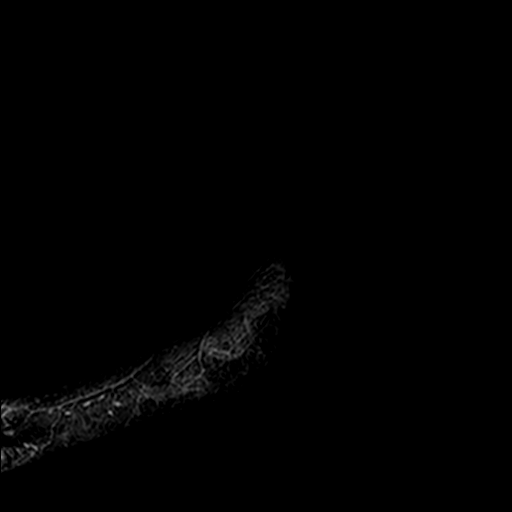

[40 of 40 positions shown; findings below may reference images not displayed]

FINDINGS: TENDONS

Peroneal: Normal.

Posteromedial: There is a longitudinal intrasubstance tear of the
posterior tibialis tendon which appears to extend to the surface of
the tendon on image 16 of series 3. There is inflammation of the
adjacent soft tissues and of the tendon sheath adjacent to the tear.
There is also slight edema in the posterior aspect of the medial
malleolus at the level of the tear. The other posteromedial tendons
are normal.

Anterior: Normal.

Achilles: Slight degenerative hypertrophy of the distal Achilles
tendon.

Plantar Fascia: Normal.

LIGAMENTS

Lateral: Intact.

Medial: Intact.

CARTILAGE

Ankle Joint: Normal.

Subtalar Joints/Sinus Tarsi: Minimal degenerative changes at the
subtalar joint.

Bones: Slight edema in the medial malleolus adjacent to the tear of
the posterior tibialis tendon.

Other: None
IMPRESSION: 1. Intrasubstance longitudinal split tear of the posterior tibialis
tendon with a single point of extension to the tendon surface with
adjacent inflammation of the soft tissues and in the adjacent medial
malleolus.
2. Slight degenerative hypertrophy of the distal Achilles tendon.

## 2020-09-09 ENCOUNTER — Telehealth: Payer: Self-pay | Admitting: Podiatry

## 2020-09-09 NOTE — Telephone Encounter (Signed)
Pt left message asking if the company we get our orthotics from is caught up because she needs to get her orthotics adjusted and Liliane Channel said for her to wait until they caught up.ok to leave a message.  I returned call and left message for pt that the company is almost caught up but to call and get scheduled because Rick's schedule in [place] is booked out for several weeks. We could possibly do [place] next week but to call to discuss.

## 2020-09-12 DIAGNOSIS — H353212 Exudative age-related macular degeneration, right eye, with inactive choroidal neovascularization: Secondary | ICD-10-CM | POA: Diagnosis not present

## 2020-09-16 ENCOUNTER — Other Ambulatory Visit: Payer: Self-pay

## 2020-09-16 ENCOUNTER — Ambulatory Visit: Payer: PPO | Admitting: Orthotics

## 2020-09-16 DIAGNOSIS — Z9889 Other specified postprocedural states: Secondary | ICD-10-CM

## 2020-09-16 DIAGNOSIS — M76822 Posterior tibial tendinitis, left leg: Secondary | ICD-10-CM

## 2020-09-16 NOTE — Progress Notes (Signed)
Sending back to [place] to make adjustments:  Offload navicular, add 4* meidal posting.

## 2020-10-15 ENCOUNTER — Ambulatory Visit (INDEPENDENT_AMBULATORY_CARE_PROVIDER_SITE_OTHER): Payer: PPO | Admitting: Podiatry

## 2020-10-15 ENCOUNTER — Other Ambulatory Visit: Payer: Self-pay

## 2020-10-15 DIAGNOSIS — Z9889 Other specified postprocedural states: Secondary | ICD-10-CM

## 2020-10-15 DIAGNOSIS — M76822 Posterior tibial tendinitis, left leg: Secondary | ICD-10-CM

## 2020-10-15 NOTE — Patient Instructions (Signed)

## 2020-10-15 NOTE — Progress Notes (Signed)
Patient presents for orthotic pick up. Patient voices no new complaints.  Orthotics were fitted to patient's feet. No discomfort and no rubbing. Patient satisfied with the orthotics.  Orthotics were dispensed to patient with instructions for break in wear and to call the office with any concerns or questions.

## 2020-10-17 DIAGNOSIS — H353222 Exudative age-related macular degeneration, left eye, with inactive choroidal neovascularization: Secondary | ICD-10-CM | POA: Diagnosis not present

## 2020-11-17 DIAGNOSIS — E785 Hyperlipidemia, unspecified: Secondary | ICD-10-CM | POA: Diagnosis not present

## 2020-11-17 DIAGNOSIS — M8589 Other specified disorders of bone density and structure, multiple sites: Secondary | ICD-10-CM | POA: Diagnosis not present

## 2020-11-17 DIAGNOSIS — I10 Essential (primary) hypertension: Secondary | ICD-10-CM | POA: Diagnosis not present

## 2020-11-18 DIAGNOSIS — H401233 Low-tension glaucoma, bilateral, severe stage: Secondary | ICD-10-CM | POA: Diagnosis not present

## 2020-11-18 DIAGNOSIS — H2513 Age-related nuclear cataract, bilateral: Secondary | ICD-10-CM | POA: Diagnosis not present

## 2020-11-27 DIAGNOSIS — H353221 Exudative age-related macular degeneration, left eye, with active choroidal neovascularization: Secondary | ICD-10-CM | POA: Diagnosis not present

## 2020-11-27 DIAGNOSIS — H353212 Exudative age-related macular degeneration, right eye, with inactive choroidal neovascularization: Secondary | ICD-10-CM | POA: Diagnosis not present

## 2020-11-27 DIAGNOSIS — H353222 Exudative age-related macular degeneration, left eye, with inactive choroidal neovascularization: Secondary | ICD-10-CM | POA: Diagnosis not present

## 2020-12-04 DIAGNOSIS — M8588 Other specified disorders of bone density and structure, other site: Secondary | ICD-10-CM | POA: Diagnosis not present

## 2020-12-04 DIAGNOSIS — Z780 Asymptomatic menopausal state: Secondary | ICD-10-CM | POA: Diagnosis not present

## 2020-12-04 DIAGNOSIS — N951 Menopausal and female climacteric states: Secondary | ICD-10-CM | POA: Diagnosis not present

## 2020-12-10 DIAGNOSIS — E785 Hyperlipidemia, unspecified: Secondary | ICD-10-CM | POA: Diagnosis not present

## 2020-12-10 DIAGNOSIS — I10 Essential (primary) hypertension: Secondary | ICD-10-CM | POA: Diagnosis not present

## 2020-12-18 DIAGNOSIS — H353222 Exudative age-related macular degeneration, left eye, with inactive choroidal neovascularization: Secondary | ICD-10-CM | POA: Diagnosis not present

## 2021-01-01 DIAGNOSIS — H353231 Exudative age-related macular degeneration, bilateral, with active choroidal neovascularization: Secondary | ICD-10-CM | POA: Diagnosis not present

## 2021-02-05 DIAGNOSIS — H401232 Low-tension glaucoma, bilateral, moderate stage: Secondary | ICD-10-CM | POA: Diagnosis not present

## 2021-02-05 DIAGNOSIS — H353231 Exudative age-related macular degeneration, bilateral, with active choroidal neovascularization: Secondary | ICD-10-CM | POA: Diagnosis not present

## 2021-02-05 DIAGNOSIS — H2513 Age-related nuclear cataract, bilateral: Secondary | ICD-10-CM | POA: Diagnosis not present

## 2021-02-05 DIAGNOSIS — H04123 Dry eye syndrome of bilateral lacrimal glands: Secondary | ICD-10-CM | POA: Diagnosis not present

## 2021-03-05 DIAGNOSIS — H353230 Exudative age-related macular degeneration, bilateral, stage unspecified: Secondary | ICD-10-CM | POA: Diagnosis not present

## 2021-04-10 DIAGNOSIS — H353220 Exudative age-related macular degeneration, left eye, stage unspecified: Secondary | ICD-10-CM | POA: Diagnosis not present

## 2021-04-10 DIAGNOSIS — H353211 Exudative age-related macular degeneration, right eye, with active choroidal neovascularization: Secondary | ICD-10-CM | POA: Diagnosis not present

## 2021-05-14 DIAGNOSIS — H353230 Exudative age-related macular degeneration, bilateral, stage unspecified: Secondary | ICD-10-CM | POA: Diagnosis not present

## 2021-05-29 DIAGNOSIS — H353231 Exudative age-related macular degeneration, bilateral, with active choroidal neovascularization: Secondary | ICD-10-CM | POA: Diagnosis not present

## 2021-05-29 DIAGNOSIS — H269 Unspecified cataract: Secondary | ICD-10-CM | POA: Diagnosis not present

## 2021-05-29 DIAGNOSIS — H04123 Dry eye syndrome of bilateral lacrimal glands: Secondary | ICD-10-CM | POA: Diagnosis not present

## 2021-05-29 DIAGNOSIS — H401231 Low-tension glaucoma, bilateral, mild stage: Secondary | ICD-10-CM | POA: Diagnosis not present

## 2021-06-18 DIAGNOSIS — H353231 Exudative age-related macular degeneration, bilateral, with active choroidal neovascularization: Secondary | ICD-10-CM | POA: Diagnosis not present

## 2021-06-22 DIAGNOSIS — Z8249 Family history of ischemic heart disease and other diseases of the circulatory system: Secondary | ICD-10-CM | POA: Diagnosis not present

## 2021-06-22 DIAGNOSIS — M8589 Other specified disorders of bone density and structure, multiple sites: Secondary | ICD-10-CM | POA: Diagnosis not present

## 2021-06-22 DIAGNOSIS — E785 Hyperlipidemia, unspecified: Secondary | ICD-10-CM | POA: Diagnosis not present

## 2021-06-22 DIAGNOSIS — Z0000 Encounter for general adult medical examination without abnormal findings: Secondary | ICD-10-CM | POA: Diagnosis not present

## 2021-06-22 DIAGNOSIS — I10 Essential (primary) hypertension: Secondary | ICD-10-CM | POA: Diagnosis not present

## 2021-06-22 DIAGNOSIS — Z1231 Encounter for screening mammogram for malignant neoplasm of breast: Secondary | ICD-10-CM | POA: Diagnosis not present

## 2021-07-08 DIAGNOSIS — E785 Hyperlipidemia, unspecified: Secondary | ICD-10-CM | POA: Diagnosis not present

## 2021-07-08 DIAGNOSIS — I10 Essential (primary) hypertension: Secondary | ICD-10-CM | POA: Diagnosis not present

## 2021-07-08 DIAGNOSIS — Z8249 Family history of ischemic heart disease and other diseases of the circulatory system: Secondary | ICD-10-CM | POA: Diagnosis not present

## 2021-07-10 DIAGNOSIS — H353231 Exudative age-related macular degeneration, bilateral, with active choroidal neovascularization: Secondary | ICD-10-CM | POA: Diagnosis not present

## 2021-07-14 DIAGNOSIS — U071 COVID-19: Secondary | ICD-10-CM | POA: Diagnosis not present

## 2021-07-14 DIAGNOSIS — Z03818 Encounter for observation for suspected exposure to other biological agents ruled out: Secondary | ICD-10-CM | POA: Diagnosis not present

## 2021-08-20 ENCOUNTER — Other Ambulatory Visit: Payer: Self-pay

## 2021-08-20 ENCOUNTER — Ambulatory Visit: Payer: PPO

## 2021-08-20 DIAGNOSIS — Z9889 Other specified postprocedural states: Secondary | ICD-10-CM

## 2021-08-20 DIAGNOSIS — M76822 Posterior tibial tendinitis, left leg: Secondary | ICD-10-CM

## 2021-08-20 DIAGNOSIS — M76821 Posterior tibial tendinitis, right leg: Secondary | ICD-10-CM

## 2021-08-20 NOTE — Progress Notes (Signed)
SITUATION Reason for Consult: Evaluation for Bilateral Custom Foot Orthoses Patient / Caregiver Report: Patient is ready for foot orthotics  OBJECTIVE DATA: Patient History / Diagnosis:    ICD-10-CM   1. Posterior tibial tendon dysfunction (PTTD) of left lower extremity  M76.822     2. Status post left foot surgery  Z98.890       Current or Previous Devices: Custom foot orthotics from [facility] Examination: Skin presentation:   Intact Ulcers & Callousing:   None and no history Toe / Foot Deformities:  None Weight Bearing Presentation:  Rectus Sensation:    Intact  ORTHOTIC RECOMMENDATION Recommended Device: 1x pair of custom functional foot orthotics  GOALS OF ORTHOSES - Reduce Pain - Prevent Foot Deformity - Prevent Progression of Further Foot Deformity - Relieve Pressure - Improve the Overall Biomechanical Function of the Foot and Lower Extremity.  ACTIONS PERFORMED Patient was casted for Foot Orthoses via crush box. Procedure was explained and patient tolerated procedure well. All questions were answered and concerns addressed.  PLAN Potential out of pocket cost was communicated to patient. Casts are to be sent to [facility] for fabrication. Patient is to be called for fitting when devices are ready.

## 2021-08-24 ENCOUNTER — Telehealth: Payer: Self-pay | Admitting: Podiatry

## 2021-08-24 NOTE — Telephone Encounter (Signed)
Received [employer] auth # [id number] for orthotics(L3020 x2) valid 1.19.2023 thru 4.19.2023

## 2021-09-21 ENCOUNTER — Other Ambulatory Visit: Payer: Self-pay

## 2021-09-21 ENCOUNTER — Ambulatory Visit: Payer: PPO

## 2021-09-21 DIAGNOSIS — M76822 Posterior tibial tendinitis, left leg: Secondary | ICD-10-CM

## 2021-09-21 DIAGNOSIS — Z9889 Other specified postprocedural states: Secondary | ICD-10-CM

## 2021-09-21 NOTE — Progress Notes (Signed)
SITUATION: Reason for Visit: Fitting and Delivery of Custom Fabricated Foot Orthoses Patient Report: Patient reports comfort and is satisfied with device.  OBJECTIVE DATA: Patient History / Diagnosis:     ICD-10-CM   1. Posterior tibial tendon dysfunction (PTTD) of left lower extremity  M76.822     2. Status post left foot surgery  Z98.890       Provided Device:  Custom Functional Foot Orthotics     [facility]: [id number]   GOAL OF ORTHOSIS - Improve gait - Decrease energy expenditure - Improve Balance - Provide Triplanar stability of foot complex - Facilitate motion  ACTIONS PERFORMED Patient was fit with foot orthotics trimmed to shoe last. Patient tolerated fittign procedure.   Patient was provided with verbal and written instruction and demonstration regarding donning, doffing, wear, care, proper fit, function, purpose, cleaning, and use of the orthosis and in all related precautions and risks and benefits regarding the orthosis.  Patient was also provided with verbal instruction regarding how to report any failures or malfunctions of the orthosis and necessary follow up care. Patient was also instructed to contact our office regarding any change in status that may affect the function of the orthosis.  Patient demonstrated independence with proper donning, doffing, and fit and verbalized understanding of all instructions.  PLAN: Patient is to follow up in one week or as necessary (PRN). All questions were answered and concerns addressed. Plan of care was discussed with and agreed upon by the patient.

## 2021-10-13 ENCOUNTER — Other Ambulatory Visit: Payer: Self-pay

## 2021-10-13 ENCOUNTER — Ambulatory Visit: Payer: PPO

## 2021-10-13 DIAGNOSIS — M76822 Posterior tibial tendinitis, left leg: Secondary | ICD-10-CM

## 2021-10-13 DIAGNOSIS — Z9889 Other specified postprocedural states: Secondary | ICD-10-CM

## 2021-10-13 NOTE — Progress Notes (Signed)
SITUATION ?Reason for Consult: Follow-up with custom foot orthotics ?Patient / Caregiver Report: Patient reports supination and excessive arch pressure on the right, pronation on the left ? ?OBJECTIVE DATA ?History / Diagnosis:  ?  ICD-10-CM   ?1. Posterior tibial tendon dysfunction (PTTD) of left lower extremity  D74.128   ?  ?2. Status post left foot surgery  Z98.890   ?  ? ? ?Change in Pathology: None ? ?ACTIONS PERFORMED ?Patient's equipment was checked for structural stability and fit. Adjusted angle of heel posting bilateral. Removed material from right arch. Device(s) intact and fit is excellent. All questions answered and concerns addressed. ? ?PLAN ?Follow-up as needed (PRN). Plan of care discussed with and agreed upon by patient / caregiver. ? ?

## 2022-03-23 ENCOUNTER — Ambulatory Visit (INDEPENDENT_AMBULATORY_CARE_PROVIDER_SITE_OTHER): Payer: PPO | Admitting: Podiatry

## 2022-03-23 ENCOUNTER — Telehealth: Payer: Self-pay | Admitting: Podiatry

## 2022-03-23 DIAGNOSIS — M76822 Posterior tibial tendinitis, left leg: Secondary | ICD-10-CM | POA: Diagnosis not present

## 2022-03-23 MED ORDER — MELOXICAM 15 MG PO TABS
ORAL_TABLET | ORAL | 3 refills | Status: DC
Start: 2022-03-23 — End: 2024-04-06

## 2022-03-23 NOTE — Progress Notes (Signed)
   Chief Complaint  Patient presents with   Ankle Pain    Patient is here for bilateral ankle pain.    Subjective:  Patient presents today for follow-up evaluation of a history of a posterior tibial tendinitis chronic to the bilateral lower extremities.  She does have a history of PT tendon repair left.. DOS: 10/12/2018.  Patient states that overall she is improved from prior to surgery however she continues to have some achiness and tenderness to the bilateral feet.  She presents for further treatment and evaluation No past medical history on file.   Objective: Physical Exam General: The patient is alert and oriented x3 in no acute distress.  Dermatology: Skin is cool, dry and supple bilateral lower extremities. Negative for open lesions or macerations.  Vascular: Palpable pedal pulses bilaterally. No edema or erythema noted. Capillary refill within normal limits.  Neurological: Epicritic and protective threshold grossly intact bilaterally.   Musculoskeletal Exam: All pedal and ankle joints range of motion within normal limits bilateral. Muscle strength 5/5 in all groups bilateral.  There is some mild tenderness to palpation along the bilateral posterior tibial tendon sheath.   Assessment: 1. s/p posterior tibial tendon repair left. DOS: 10/12/2018 2.  Posterior tibial tendinitis bilateral   Plan of Care:  1. Patient was evaluated.   2.  Continue custom molded orthotics with good supportive sneakers 3.  Continue meloxicam 15 mg as needed 4.  Ankle brace is dispensed today wear daily as needed 5.  Continue conservative treatment. 6.  Return to clinic as needed  Edrick Kins, DPM [facility]  Dr. Edrick Kins, [address]                                        [place], [place] [address]                Office [phone]  Fax [phone]

## 2022-03-23 NOTE — Addendum Note (Signed)
Addended by: Edrick Kins on: 03/23/2022 06:42 PM   Modules accepted: Orders

## 2022-03-23 NOTE — Telephone Encounter (Signed)
Sent.  Thanks, Dr. Juliah Scadden

## 2022-03-23 NOTE — Telephone Encounter (Signed)
Pt would like meloxicam called in to pharmacy.  [facility] #[id number] - [place], [place]   Please advise.

## 2022-03-25 ENCOUNTER — Telehealth: Payer: Self-pay | Admitting: Podiatry

## 2022-03-25 NOTE — Telephone Encounter (Signed)
Patient called and wanted to let Dr. Amalia Hailey know about her knee replacement. The name is Hospital doctor.

## 2024-04-06 ENCOUNTER — Ambulatory Visit

## 2024-04-06 ENCOUNTER — Ambulatory Visit: Admitting: Podiatry

## 2024-04-06 ENCOUNTER — Encounter: Payer: Self-pay | Admitting: Podiatry

## 2024-04-06 VITALS — Ht 61.0 in | Wt 134.0 lb

## 2024-04-06 DIAGNOSIS — M7752 Other enthesopathy of left foot: Secondary | ICD-10-CM | POA: Diagnosis not present

## 2024-04-06 DIAGNOSIS — M7751 Other enthesopathy of right foot: Secondary | ICD-10-CM

## 2024-04-06 DIAGNOSIS — M76822 Posterior tibial tendinitis, left leg: Secondary | ICD-10-CM

## 2024-04-06 DIAGNOSIS — M76821 Posterior tibial tendinitis, right leg: Secondary | ICD-10-CM

## 2024-04-06 DIAGNOSIS — L600 Ingrowing nail: Secondary | ICD-10-CM

## 2024-04-06 MED ORDER — BETAMETHASONE SOD PHOS & ACET 6 (3-3) MG/ML IJ SUSP
3.0000 mg | Freq: Once | INTRAMUSCULAR | Status: AC
  Administered 2024-04-06: 3 mg via INTRA_ARTICULAR

## 2024-04-06 MED ORDER — MELOXICAM 15 MG PO TABS
15.0000 mg | ORAL_TABLET | Freq: Every day | ORAL | 1 refills | Status: AC
Start: 2024-04-06 — End: 2024-08-04

## 2024-04-06 NOTE — Progress Notes (Signed)
   Chief Complaint  Patient presents with   Foot Pain    Pt is here due to bilateral foot and ankle pain, states the right foot and ankle has been hurting for years, pain comes and goes, left foot previously had surgery want it to be check on due to pain.    Subjective:  Patient presents today for follow-up evaluation of a history of a posterior tibial tendinitis chronic to the bilateral lower extremities.  She does have a history of PT tendon repair left.. DOS: 10/12/2018.   Patient has been very active recently caring for her husband and she states that by the end of the day she has excruciating pain along the posterior tibial tendons bilateral.  Meloxicam  does offer some temporary relief.  Patient also has a new complaint today regarding ingrown toenail to the medial border of the right great toe.  It is very sensitive and tender.  This has been intermittent for several months now.  No past medical history on file.   Objective: Physical Exam General: The patient is alert and oriented x3 in no acute distress.  Dermatology: Skin is cool, dry and supple bilateral lower extremities. Negative for open lesions or macerations.  Ingrowing toenail noted to the medial border of the right great toe with associated tenderness  Vascular: Palpable pedal pulses bilaterally. No edema or erythema noted. Capillary refill within normal limits.  Neurological: Grossly intact via light touch  Musculoskeletal Exam: All pedal and ankle joints range of motion within normal limits bilateral. Muscle strength 5/5 in all groups bilateral.  Tenderness on palpation along the posterior tibial tendon bilateral.  Radiographic exam B/L foot and ankle 04/06/2024: Mild degenerative changes noted throughout the ankle and pedal joints of the foot bilateral.  No acute fractures identified.  There is some irregularity along the distal fibula of the right ankle but there is no acute fracture noted.  Assessment: 1. H/o  posterior tibial tendon repair left. DOS: 10/12/2018 2.  Posterior tibial tendinitis bilateral 3.  Ingrown toenail medial border right great toe   Plan of Care:  -Patient was evaluated.  X-rays reviewed -Injection of 0.5 cc Celestone  Soluspan injected along the posterior tibial tendon bilateral  -Continue meloxicam  15 mg daily as needed.  Refill provided -Continue custom molded orthotics.  She has an appointment with our orthotics department to be recast for new orthotics -In regards to the ingrown toenail we discussed different treatment options including partial nail matricectomy which I do believe is appropriate.  She has had this procedure performed in the past other toes with good success.  She would like to have this performed today -The toe was prepped in aseptic manner and digital block performed using 3 mL of 2% lidocaine plain.  The offending border of the nail plate was avulsed followed by 3 x 30-second application of phenol and alcohol flush.  Dressings applied and post care instructions provided -Return to clinic 3 weeks   Thresa EMERSON Sar, DPM [facility]  Dr. Thresa EMERSON Sar, DPM    [address]                                        [place], [place] [address]                Office [phone]  Fax [phone]

## 2024-04-06 NOTE — Patient Instructions (Signed)

## 2024-04-20 ENCOUNTER — Other Ambulatory Visit

## 2024-05-04 ENCOUNTER — Ambulatory Visit: Admitting: Podiatry

## 2024-05-11 ENCOUNTER — Encounter: Payer: Self-pay | Admitting: Podiatry

## 2024-05-11 ENCOUNTER — Ambulatory Visit: Admitting: Podiatry

## 2024-05-11 VITALS — Ht 61.0 in | Wt 134.0 lb

## 2024-05-11 DIAGNOSIS — L600 Ingrowing nail: Secondary | ICD-10-CM

## 2024-05-11 NOTE — Progress Notes (Signed)
   Chief Complaint  Patient presents with   Ingrown Toenail    Pt is here to f/u on right great toenail after having ingrown removed, has no complaints.    Subjective: 78 y.o. female presents today status post permanent nail avulsion procedure of the medial border right great toe that was performed on medial border right great toe.  Doing well.  No pain  No past medical history on file.  Objective: Neurovascular status intact.  Skin is warm, dry and supple. Nail and respective nail fold appears to be healing appropriately.   No tenderness to palpation along the posterior tibial tendons bilateral.  She states the injections helped significantly  Assessment: #1 s/p partial permanent nail matrixectomy medial border right great toe.  04/06/2024 #2 h/o posterior tibial tendon repair left. DOS: 10/12/2018 #3 posterior tibial tendinitis bilateral; currently asymptomatic   Plan of care: #1 patient was evaluated  #2 light debridement of the periungual debris was performed to the border of the respective toe and nail plate using a tissue nipper. #3 patient is to return to clinic on a PRN basis.   Thresa EMERSON Sar, DPM [facility]  Dr. Thresa EMERSON Sar, DPM    [address] [place], [place] [address]                Office [phone]  Fax [phone]

## 2024-05-18 ENCOUNTER — Other Ambulatory Visit

## 2024-05-18 ENCOUNTER — Ambulatory Visit (INDEPENDENT_AMBULATORY_CARE_PROVIDER_SITE_OTHER)

## 2024-05-18 DIAGNOSIS — M76822 Posterior tibial tendinitis, left leg: Secondary | ICD-10-CM

## 2024-05-18 DIAGNOSIS — M2142 Flat foot [pes planus] (acquired), left foot: Secondary | ICD-10-CM | POA: Diagnosis not present

## 2024-05-18 DIAGNOSIS — M76821 Posterior tibial tendinitis, right leg: Secondary | ICD-10-CM | POA: Diagnosis not present

## 2024-05-18 DIAGNOSIS — M2141 Flat foot [pes planus] (acquired), right foot: Secondary | ICD-10-CM

## 2024-05-18 NOTE — Progress Notes (Signed)
 Patient was seen evaluated and scanned for Custom Foot orthotics  CFO's will provide total contact to BIL MLA's helping to balance and distribute body weight more evenly across feet helping to reduce plantar pressure and pain  CFO's encourage FF/RF alignment and reduce likelihood of greater deformity if any  Orthotic order placed patient will be notified when in and then return for fitting  Lolita Schultze CPed, CFo, CFm  Patient states oop max met otherwise 20% coins would remain patient is aware  Lolita Schultze Cped, CFo, CFm

## 2024-05-18 NOTE — Progress Notes (Deleted)
 Orthotics   Patient was present and evaluated for Custom molded foot orthotics. Patient will benefit from CFO's to provide total contact to BIL MLA's helping to balance and distribute body weight more evenly across BIL feet helping to reduce plantar pressure and pain. Orthotic will also encourage FF / RF alignment  Patient was scanned today and will return for fitting upon receipt

## 2024-07-13 ENCOUNTER — Ambulatory Visit (INDEPENDENT_AMBULATORY_CARE_PROVIDER_SITE_OTHER): Admitting: Podiatrist

## 2024-07-13 DIAGNOSIS — M2142 Flat foot [pes planus] (acquired), left foot: Secondary | ICD-10-CM

## 2024-07-13 DIAGNOSIS — M76822 Posterior tibial tendinitis, left leg: Secondary | ICD-10-CM

## 2024-07-13 DIAGNOSIS — M2141 Flat foot [pes planus] (acquired), right foot: Secondary | ICD-10-CM

## 2024-07-13 NOTE — Progress Notes (Signed)
 Sarah Vazquez presents to pick up he orthotics. She states they are not as supportive as he last pair in the arch where the posterior tibial tendon was repaired- left.  He current orthotics have a soft medial arch fill and the new ones do not have the same accommodation.  I will have the new orthotics modified.  Will call when ready for pick up.

## 2024-07-24 ENCOUNTER — Telehealth: Payer: Self-pay | Admitting: Podiatrist

## 2024-07-24 NOTE — Telephone Encounter (Signed)
 Patient called and would like to know if the pair of orthotics that she picked up was the new pair that were remade for her, or was it the original pair that she had.

## 2024-08-07 ENCOUNTER — Ambulatory Visit: Admitting: Podiatry

## 2024-08-07 DIAGNOSIS — M76821 Posterior tibial tendinitis, right leg: Secondary | ICD-10-CM

## 2024-08-07 DIAGNOSIS — M76822 Posterior tibial tendinitis, left leg: Secondary | ICD-10-CM

## 2024-08-07 MED ORDER — BETAMETHASONE SOD PHOS & ACET 6 (3-3) MG/ML IJ SUSP
3.0000 mg | Freq: Once | INTRAMUSCULAR | Status: AC
  Administered 2024-08-07: 3 mg via INTRA_ARTICULAR

## 2024-08-07 NOTE — Progress Notes (Signed)
" ° °  Chief Complaint  Patient presents with   Routine Post Op     Ingrown toenail follow up DOS 04/06/24. She has no complaints. She is having issues with the orthotics that she picked up -she states they should have arch support but do not.     Subjective:  Patient presents today for follow-up evaluation of a history of a posterior tibial tendinitis chronic to the bilateral lower extremities.  She does have a history of PT tendon repair left.. DOS: 10/12/2018.   No past medical history on file.   Objective: Physical Exam General: The patient is alert and oriented x3 in no acute distress.  Dermatology: Skin is cool, dry and supple bilateral lower extremities. Negative for open lesions or macerations.  Ingrowing toenail noted to the medial border of the right great toe with associated tenderness  Vascular: Palpable pedal pulses bilaterally. No edema or erythema noted. Capillary refill within normal limits.  Neurological: Grossly intact via light touch  Musculoskeletal Exam: All pedal and ankle joints range of motion within normal limits bilateral. Muscle strength 5/5 in all groups bilateral.  Tenderness on palpation along the posterior tibial tendon bilateral.  Radiographic exam B/L foot and ankle 04/06/2024: Mild degenerative changes noted throughout the ankle and pedal joints of the foot bilateral.  No acute fractures identified.  There is some irregularity along the distal fibula of the right ankle but there is no acute fracture noted.  Assessment: 1. H/o posterior tibial tendon repair left. DOS: 10/12/2018 2.  Posterior tibial tendinitis bilateral  Plan of Care:  -Patient was evaluated.   -Injection of 0.5 cc Celestone  Soluspan injected along the posterior tibial tendon bilateral  -Continue meloxicam  15 mg daily as needed.  Refill provided - Custom molded orthotics were made for the patient however at the time of dispensable on 07/24/2024 they did not seem to support her arches  appropriately.  New orthotics have been ordered and are pending dispensable.  Will contact the patient once they are here to be dispensed -Return to clinic with me PRN.  Thresa EMERSON Sar, DPM [facility]  Dr. Thresa EMERSON Sar, DPM    [address]                                        [place], [place] [address]                Office [phone]  Fax [phone]          "

## 2024-08-17 ENCOUNTER — Telehealth: Payer: Self-pay | Admitting: Podiatry

## 2024-08-17 NOTE — Telephone Encounter (Signed)
 Orthotics are in [place] office. They are schedule to [facility] on 08/21/2024 @ 10:45AM - Sent to [facility]   08/17/2024

## 2024-08-21 ENCOUNTER — Ambulatory Visit: Admitting: Podiatry

## 2024-08-21 DIAGNOSIS — M76821 Posterior tibial tendinitis, right leg: Secondary | ICD-10-CM

## 2024-08-21 DIAGNOSIS — M76822 Posterior tibial tendinitis, left leg: Secondary | ICD-10-CM

## 2024-08-21 NOTE — Progress Notes (Signed)
 Orthotics are dispensed they are functioning well no acute complaints.  Break-in period was discussed.

## 2024-08-30 ENCOUNTER — Ambulatory Visit (INDEPENDENT_AMBULATORY_CARE_PROVIDER_SITE_OTHER): Admitting: Podiatrist

## 2024-08-30 ENCOUNTER — Other Ambulatory Visit

## 2024-08-30 DIAGNOSIS — M76821 Posterior tibial tendinitis, right leg: Secondary | ICD-10-CM

## 2024-08-30 DIAGNOSIS — M76822 Posterior tibial tendinitis, left leg: Secondary | ICD-10-CM

## 2024-08-30 NOTE — Progress Notes (Signed)
 Sarah Vazquez presents for an orthotic adjustment.  She relates the medial arch feels good but it doesn't support her left foot like her old orthotics do. She wonders if an adjustment can be made.  The old orthotics appear to be from [facility].  The new orthotics are softer and have more flex. To match the old pair, she will need a harder shell orthotic.  I re scanned her today and will make an orthotic with a thicker shell.    Will call when ready for pickup-  will have her bring back her recently dispensed orthotic to swap out.
